# Patient Record
Sex: Male | Born: 1950 | Race: Black or African American | Hispanic: No | Marital: Single | Smoking: Former smoker
Health system: Southern US, Community
[De-identification: ages and names within clinical notes are randomized; demographics above are authoritative.]

## PROBLEM LIST (undated history)

## (undated) DIAGNOSIS — C61 Malignant neoplasm of prostate: Secondary | ICD-10-CM

## (undated) HISTORY — PX: TOTAL HIP ARTHROPLASTY: SHX124

---

## 2014-03-10 ENCOUNTER — Encounter (HOSPITAL_BASED_OUTPATIENT_CLINIC_OR_DEPARTMENT_OTHER): Payer: Self-pay | Admitting: *Deleted

## 2014-03-10 ENCOUNTER — Emergency Department (HOSPITAL_BASED_OUTPATIENT_CLINIC_OR_DEPARTMENT_OTHER): Payer: Non-veteran care

## 2014-03-10 ENCOUNTER — Emergency Department (HOSPITAL_BASED_OUTPATIENT_CLINIC_OR_DEPARTMENT_OTHER)
Admission: EM | Admit: 2014-03-10 | Discharge: 2014-03-10 | Disposition: A | Discharge status: Home / Self Care | Payer: Non-veteran care | Attending: Emergency Medicine | Admitting: Emergency Medicine

## 2014-03-10 DIAGNOSIS — S300XXA Contusion of lower back and pelvis, initial encounter: Secondary | ICD-10-CM | POA: Diagnosis not present

## 2014-03-10 DIAGNOSIS — Y9389 Activity, other specified: Secondary | ICD-10-CM | POA: Insufficient documentation

## 2014-03-10 DIAGNOSIS — S79912A Unspecified injury of left hip, initial encounter: Secondary | ICD-10-CM | POA: Insufficient documentation

## 2014-03-10 DIAGNOSIS — Y998 Other external cause status: Secondary | ICD-10-CM | POA: Diagnosis not present

## 2014-03-10 DIAGNOSIS — Z7982 Long term (current) use of aspirin: Secondary | ICD-10-CM | POA: Diagnosis not present

## 2014-03-10 DIAGNOSIS — W1839XA Other fall on same level, initial encounter: Secondary | ICD-10-CM | POA: Insufficient documentation

## 2014-03-10 DIAGNOSIS — Z96642 Presence of left artificial hip joint: Secondary | ICD-10-CM | POA: Insufficient documentation

## 2014-03-10 DIAGNOSIS — Y929 Unspecified place or not applicable: Secondary | ICD-10-CM | POA: Insufficient documentation

## 2014-03-10 DIAGNOSIS — Z79899 Other long term (current) drug therapy: Secondary | ICD-10-CM | POA: Insufficient documentation

## 2014-03-10 DIAGNOSIS — W19XXXA Unspecified fall, initial encounter: Secondary | ICD-10-CM

## 2014-03-10 DIAGNOSIS — Z880 Allergy status to penicillin: Secondary | ICD-10-CM | POA: Insufficient documentation

## 2014-03-10 DIAGNOSIS — T148XXA Other injury of unspecified body region, initial encounter: Secondary | ICD-10-CM

## 2014-03-10 DIAGNOSIS — S3992XA Unspecified injury of lower back, initial encounter: Secondary | ICD-10-CM | POA: Diagnosis present

## 2014-03-10 MED ORDER — OXYCODONE-ACETAMINOPHEN 5-325 MG PO TABS: 1.0000 | ORAL_TABLET | ORAL | Status: DC | PRN

## 2014-03-10 NOTE — Discharge Instructions (Signed)
Contusion °A contusion is a deep bruise. Contusions happen when an injury causes bleeding under the skin. Signs of bruising include pain, puffiness (swelling), and discolored skin. The contusion may turn blue, purple, or yellow. °HOME CARE  °· Put ice on the injured area. °¨ Put ice in a plastic bag. °¨ Place a towel between your skin and the bag. °¨ Leave the ice on for 15-20 minutes, 03-04 times a day. °· Only take medicine as told by your doctor. °· Rest the injured area. °· If possible, raise (elevate) the injured area to lessen puffiness. °GET HELP RIGHT AWAY IF:  °· You have more bruising or puffiness. °· You have pain that is getting worse. °· Your puffiness or pain is not helped by medicine. °MAKE SURE YOU:  °· Understand these instructions. °· Will watch your condition. °· Will get help right away if you are not doing well or get worse. °Document Released: 09/19/2007 Document Revised: 06/25/2011 Document Reviewed: 02/05/2011 °ExitCare® Patient Information ©2015 [employer]. This information is not intended to replace advice given to you by your health care provider. Make sure you discuss any questions you have with your health care provider. ° °

## 2014-03-10 NOTE — ED Provider Notes (Signed)
CSN: [id number]     Arrival date & time 03/10/14  0730 History   None    Chief Complaint  Patient presents with  . Fall     (Consider location/radiation/quality/duration/timing/severity/associated sxs/prior Treatment) HPI Comments: Patient presents to the ER for evaluation of injury from a fall. Patient reports that he got up to go to the bathroom this morning and his left hip gave out. He fell to the ground, landing on his left side. No head injury or loss of consciousness. Patient reports that he is experiencing severe pain in the left hip and across his lower back. No upper back or neck pain. Denies chest pain, shortness of breath. Fall was caused by his leg giving out, he did not pass out.  She reports that he had left hip surgery at [facility] several months ago. He thinks that's what caused his leg give out. He is concerned that he might have injured the hip when he fell.  Patient is a 63 y.o. male presenting with fall.  Fall    History reviewed. No pertinent past medical history. Past Surgical History  Procedure Laterality Date  . Total hip arthroplasty     History reviewed. No pertinent family history. History  Substance Use Topics  . Smoking status: Former Research scientist (life sciences)  . Smokeless tobacco: Not on file  . Alcohol Use: Not on file    Review of Systems  Musculoskeletal: Positive for back pain and arthralgias.  All other systems reviewed and are negative.     Allergies  Bee venom and Penicillins  Home Medications   Prior to Admission medications   Medication Sig Start Date End Date Taking? Authorizing Provider  ascorbic acid (VITAMIN C) 500 MG tablet Take 500 mg by mouth 3 (three) times daily.   Yes Historical Provider, MD  aspirin 325 MG tablet Take 325 mg by mouth daily.   Yes Historical Provider, MD  aspirin-acetaminophen-caffeine (EXCEDRIN MIGRAINE) [phone] MG per tablet Take 2 tablets by mouth every 6 (six) hours as needed for headache.   Yes Historical  Provider, MD  b complex vitamins tablet Take 1 tablet by mouth daily.   Yes Historical Provider, MD  calcium citrate (CALCITRATE - DOSED IN MG ELEMENTAL CALCIUM) 950 MG tablet Take 200 mg of elemental calcium by mouth daily.   Yes Historical Provider, MD  Cholecalciferol 1000 UNITS CHEW Chew by mouth.   Yes Historical Provider, MD  oxyCODONE (OXY IR/ROXICODONE) 5 MG immediate release tablet Take 5 mg by mouth every 6 (six) hours as needed for severe pain.   Yes Historical Provider, MD  oxycodone (OXY-IR) 5 MG capsule Take 5 mg by mouth every 3 (three) hours as needed.   Yes Historical Provider, MD  sennosides-docusate sodium (SENOKOT-S) 8.6-50 MG tablet Take 1 tablet by mouth daily.   Yes Historical Provider, MD  oxyCODONE-acetaminophen (PERCOCET) 5-325 MG per tablet Take 1 tablet by mouth every 4 (four) hours as needed. 03/10/14   Orpah Greek, MD   BP 176/97 mmHg  Pulse 64  Temp(Src) 97.9 F (36.6 C) (Oral)  Resp 16  Ht 5\' 9"  (1.753 m)  Wt 165 lb (74.844 kg)  BMI 24.36 kg/m2  SpO2 100% Physical Exam  Constitutional: He is oriented to person, place, and time. He appears well-developed and well-nourished. No distress.  HENT:  Head: Normocephalic and atraumatic.  Right Ear: Hearing normal.  Left Ear: Hearing normal.  Nose: Nose normal.  Mouth/Throat: Oropharynx is clear and moist and mucous membranes are normal.  Eyes: Conjunctivae and EOM are normal. Pupils are equal, round, and reactive to light.  Neck: Normal range of motion. Neck supple.  Cardiovascular: Regular rhythm, S1 normal and S2 normal.  Exam reveals no gallop and no friction rub.   No murmur heard. Pulmonary/Chest: Effort normal and breath sounds normal. No respiratory distress. He exhibits no tenderness.  Abdominal: Soft. Normal appearance and bowel sounds are normal. There is no hepatosplenomegaly. There is no tenderness. There is no rebound, no guarding, no tenderness at McBurney's point and negative Murphy's  sign. No hernia.  Musculoskeletal: Normal range of motion.       Left hip: He exhibits tenderness and swelling (lateral soft tissue).       Left knee: Normal.       Left ankle: Normal.       Lumbar back: He exhibits tenderness.       Back:  Neurological: He is alert and oriented to person, place, and time. He has normal strength. No cranial nerve deficit or sensory deficit. Coordination normal. GCS eye subscore is 4. GCS verbal subscore is 5. GCS motor subscore is 6.  Skin: Skin is warm, dry and intact. No rash noted. No cyanosis.  Psychiatric: He has a normal mood and affect. His speech is normal and behavior is normal. Thought content normal.  Nursing note and vitals reviewed.   ED Course  Procedures (including critical care time) Labs Review Labs Reviewed - No data to display  Imaging Review Dg Lumbar Spine Complete  03/10/2014   CLINICAL DATA:  Acute bilateral lower back pain after falling at home this morning.  EXAM: LUMBAR SPINE - COMPLETE 4+ VIEW  COMPARISON:  None.  FINDINGS: There is no evidence of lumbar spine fracture. Alignment is normal. Mild degenerative disc disease is noted at L3-4 and L4-5 with anterior osteophyte formation at these levels. Posterior facet joints appear normal. Atherosclerotic calcifications of abdominal aorta are noted.  IMPRESSION: Mild degenerative disc disease is noted at L3-4 and L4-5. No acute abnormality seen in the lumbar spine.   Electronically Signed   By: Sabino Dick M.D.   On: 03/10/2014 08:24   Dg Pelvis 1-2 Views  03/10/2014   CLINICAL DATA:  Acute left hip pain and swelling after fall at home.  EXAM: PELVIS - 1-2 VIEW  COMPARISON:  None.  FINDINGS: There is no evidence of pelvic fracture or diastasis. No pelvic bone lesions are seen. The hip and sacroiliac joints appear normal.  IMPRESSION: Normal pelvis.   Electronically Signed   By: Sabino Dick M.D.   On: 03/10/2014 08:19   Dg Femur Left  03/10/2014   CLINICAL DATA:  Fall with left  hip and leg pain.  Initial encounter.  EXAM: LEFT FEMUR - 2 VIEW  COMPARISON:  None.  FINDINGS: There is no evidence of fracture, dislocation or other focal bone lesions. Soft tissues are unremarkable.  IMPRESSION: No fracture identified.   Electronically Signed   By: Aletta Edouard M.D.   On: 03/10/2014 08:20     EKG Interpretation None      MDM   Final diagnoses:  Contusion   Patient presents to the ER for evaluation of lower back and left hip area pain after a fall. Patient complaining primarily of swelling and tenderness in the soft tissues of the proximal left hip and thigh area. He has some mild diffuse lower back pain and tenderness as well. No neurologic deficit or symptoms. X-ray of the lumbar spine, pelvis, hip, femur are  obtained. No evidence of fracture or abnormality noted.    Orpah Greek, MD 03/10/14 [phone]
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
## 2014-03-10 NOTE — ED Notes (Signed)
Patient transported to X-ray 

## 2015-08-19 ENCOUNTER — Encounter (HOSPITAL_BASED_OUTPATIENT_CLINIC_OR_DEPARTMENT_OTHER): Payer: Self-pay | Admitting: *Deleted

## 2015-08-19 ENCOUNTER — Emergency Department (HOSPITAL_BASED_OUTPATIENT_CLINIC_OR_DEPARTMENT_OTHER)
Admission: EM | Admit: 2015-08-19 | Discharge: 2015-08-19 | Disposition: A | Discharge status: Home / Self Care | Payer: Non-veteran care | Attending: Emergency Medicine | Admitting: Emergency Medicine

## 2015-08-19 DIAGNOSIS — IMO0002 Reserved for concepts with insufficient information to code with codable children: Secondary | ICD-10-CM

## 2015-08-19 DIAGNOSIS — W292XXA Contact with other powered household machinery, initial encounter: Secondary | ICD-10-CM | POA: Insufficient documentation

## 2015-08-19 DIAGNOSIS — Y929 Unspecified place or not applicable: Secondary | ICD-10-CM | POA: Insufficient documentation

## 2015-08-19 DIAGNOSIS — S61211A Laceration without foreign body of left index finger without damage to nail, initial encounter: Secondary | ICD-10-CM | POA: Insufficient documentation

## 2015-08-19 DIAGNOSIS — Z87891 Personal history of nicotine dependence: Secondary | ICD-10-CM | POA: Insufficient documentation

## 2015-08-19 DIAGNOSIS — S61213A Laceration without foreign body of left middle finger without damage to nail, initial encounter: Secondary | ICD-10-CM | POA: Insufficient documentation

## 2015-08-19 DIAGNOSIS — Y999 Unspecified external cause status: Secondary | ICD-10-CM | POA: Insufficient documentation

## 2015-08-19 DIAGNOSIS — S6992XA Unspecified injury of left wrist, hand and finger(s), initial encounter: Secondary | ICD-10-CM | POA: Diagnosis present

## 2015-08-19 DIAGNOSIS — Y9389 Activity, other specified: Secondary | ICD-10-CM | POA: Insufficient documentation

## 2015-08-19 MED ORDER — TETANUS-DIPHTH-ACELL PERTUSSIS 5-2.5-18.5 LF-MCG/0.5 IM SUSP
0.5000 mL | Freq: Once | INTRAMUSCULAR | Status: AC
  Administered 2015-08-19: 0.5 mL via INTRAMUSCULAR
  Filled 2015-08-19: qty 0.5

## 2015-08-19 MED ORDER — BACITRACIN ZINC 500 UNIT/GM EX OINT
1.0000 "application " | TOPICAL_OINTMENT | Freq: Two times a day (BID) | CUTANEOUS | Status: DC
  Administered 2015-08-19: 1 via TOPICAL

## 2015-08-19 NOTE — ED Notes (Signed)
Pt reports injuring his left hand while repairing a dryer, cut 2nd and 3rd fingers with metal of dryer door. Pt states they bled more overnight so he wanted [place] to look at his lacerations. Dressings in place at triage, no active bleeding noted, states td is utd.

## 2015-08-19 NOTE — ED Provider Notes (Signed)
CSN: [id number]     Arrival date & time 08/19/15  1006 History   First MD Initiated Contact with Patient 08/19/15 1023     Chief Complaint  Patient presents with  . Hand Pain     (Consider location/radiation/quality/duration/timing/severity/associated sxs/prior Treatment) HPI Eric Beck is a 65 y.o. male with no sign of an past medical history comes in for evaluation of left hand laceration. Patient reports he was working on a dryer yesterday around 10:30 AM when the dryer slipped causing him to cut his left index and middle finger. He reports cleaning the wound with tap water and alcohol swabs and wrapping it in gauze and Band-Aids. He reports pain has persisted since that time and wanted to come to ED for evaluation. He is unsure of last tetanus shot.  History reviewed. No pertinent past medical history. Past Surgical History  Procedure Laterality Date  . Total hip arthroplasty     History reviewed. No pertinent family history. Social History  Substance Use Topics  . Smoking status: Former Research scientist (life sciences)  . Smokeless tobacco: None  . Alcohol Use: None    Review of Systems A 10 point review of systems was completed and was negative except for pertinent positives and negatives as mentioned in the history of present illness    Allergies  Bee venom and Penicillins  Home Medications   Prior to Admission medications   Not on File   BP 135/85 mmHg  Pulse 60  Temp(Src) 98 F (36.7 C) (Oral)  Resp 18  Ht 5\' 7"  (1.702 m)  Wt 68.04 kg  BMI 23.49 kg/m2  SpO2 100% Physical Exam  Constitutional:  Awake, alert, nontoxic appearance.  HENT:  Head: Atraumatic.  Eyes: Right eye exhibits no discharge. Left eye exhibits no discharge.  Neck: Neck supple.  Pulmonary/Chest: Effort normal. He exhibits no tenderness.  Abdominal: Soft. There is no tenderness. There is no rebound.  Musculoskeletal: He exhibits no tenderness.  Baseline ROM, no obvious new focal weakness.  Neurological:   Mental status and motor strength appears baseline for patient and situation.  Skin: No rash noted.  Patient with 2 linear lacerations, superficial in nature over dorsum of index and middle left fingers around the IP joint. No tendon involvement. Patient is able to flex and extend digits against resistance. Bleeding controlled. No surrounding erythema, induration or drainage.  Psychiatric: He has a normal mood and affect.  Nursing note and vitals reviewed.   ED Course  Procedures (including critical care time) Labs Review Labs Reviewed - No data to display  Imaging Review No results found. I have personally reviewed and evaluated these images and lab results as part of my medical decision-making.   EKG Interpretation None     Filed Vitals:   08/19/15 1022 08/19/15 1148  BP: 138/99 135/85  Pulse: 66 60  Temp: 98.3 F (36.8 C) 98 F (36.7 C)  TempSrc: Oral Oral  Resp: 18 18  Height: 5\' 7"  (1.702 m)   Weight: 68.04 kg   SpO2: 100% 100%    MDM  ANTHONEY DICKE is a 65 y.o. male who presents for evaluation of a laceration occurred roughly 24 hours ago. Patient applied alcohol swabs and wash wound with tap water. Came in for reevaluation. Superficial lacerations on dorsum of left index and middle finger. Neurovascularly intact. His tetanus was unknown-updated in emergency Department. Wound will be allowed to heal via secondary intention and instructed wound recheck by PCP in 3 or 4 days. Topical antibiotic  and dressing applied. No oral antibiotics indicated, no medical history that would impede normal wound healing. Overall, patient appears well, nontoxic, hemodynamically stable and appropriate for discharge and outpatient follow-up. Final diagnoses:  Laceration        Comer Locket, PA-C 08/19/15 [id number]  Blanchie Dessert, MD 08/19/15 [phone]

## 2015-08-19 NOTE — Discharge Instructions (Signed)
Please keep your wound clean and dry. Your tetanus was updated in the emergency department today. Follow-up with your doctor in the next 3 or 4 days for a wound recheck. Return to ED for any new or worsening symptoms as we discussed.

## 2015-09-21 ENCOUNTER — Emergency Department (HOSPITAL_BASED_OUTPATIENT_CLINIC_OR_DEPARTMENT_OTHER)
Admission: EM | Admit: 2015-09-21 | Discharge: 2015-09-21 | Disposition: A | Discharge status: Home / Self Care | Payer: Non-veteran care | Attending: Emergency Medicine | Admitting: Emergency Medicine

## 2015-09-21 ENCOUNTER — Encounter (HOSPITAL_BASED_OUTPATIENT_CLINIC_OR_DEPARTMENT_OTHER): Payer: Self-pay

## 2015-09-21 ENCOUNTER — Emergency Department (HOSPITAL_BASED_OUTPATIENT_CLINIC_OR_DEPARTMENT_OTHER): Payer: Non-veteran care

## 2015-09-21 DIAGNOSIS — Z79899 Other long term (current) drug therapy: Secondary | ICD-10-CM | POA: Diagnosis not present

## 2015-09-21 DIAGNOSIS — Z87891 Personal history of nicotine dependence: Secondary | ICD-10-CM | POA: Insufficient documentation

## 2015-09-21 DIAGNOSIS — Z7982 Long term (current) use of aspirin: Secondary | ICD-10-CM | POA: Insufficient documentation

## 2015-09-21 DIAGNOSIS — M25551 Pain in right hip: Secondary | ICD-10-CM

## 2015-09-21 NOTE — Discharge Instructions (Signed)
Please read and follow all provided instructions.  Your diagnoses today include:  1. Hip pain, right    Tests performed today include:  Vital signs - see below for your results today  X-ray of hip - does not show any problems  Medications prescribed:   None  Take any prescribed medications only as directed.  Home care instructions:   Follow any educational materials contained in this packet  Please rest, use ice or heat on your back for the next several days  Do not lift, push, pull anything more than 10 pounds for the next week  Follow-up instructions: Please follow-up with your primary care provider or orthopedist in the next 1 week for further evaluation of your symptoms.   Return instructions:  SEEK IMMEDIATE MEDICAL ATTENTION IF YOU HAVE:  New numbness, tingling, weakness, or problem with the use of your arms or legs  Severe back pain not relieved with medications  Loss control of your bowels or bladder  Increasing pain in any areas of the body (such as chest or abdominal pain)  Shortness of breath, dizziness, or fainting.   Worsening nausea (feeling sick to your stomach), vomiting, fever, or sweats  Any other emergent concerns regarding your health   Additional Information:  Your vital signs today were: BP 164/97 mmHg   Pulse 82   Temp(Src) 98.5 F (36.9 C) (Oral)   Resp 18   SpO2 99% If your blood pressure (BP) was elevated above 135/85 this visit, please have this repeated by your doctor within one month. --------------

## 2015-09-21 NOTE — ED Notes (Signed)
Pt here for hip pain, rt hip pain, radiates to rt leg, describes a burning sensation

## 2015-09-21 NOTE — ED Notes (Signed)
Pt returns from radiology. 

## 2015-09-21 NOTE — ED Notes (Signed)
PA-C in at bedside to see pt
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
## 2015-12-31 IMAGING — CR DG LUMBAR SPINE COMPLETE 4+V
5 series · 5 of 5 positions shown · non-contrast
Comparison: None.

CLINICAL DATA: Acute bilateral lower back pain after falling at
home this morning.

EXAM:
LUMBAR SPINE - COMPLETE 4+ VIEW

[t l-spine a.p.]
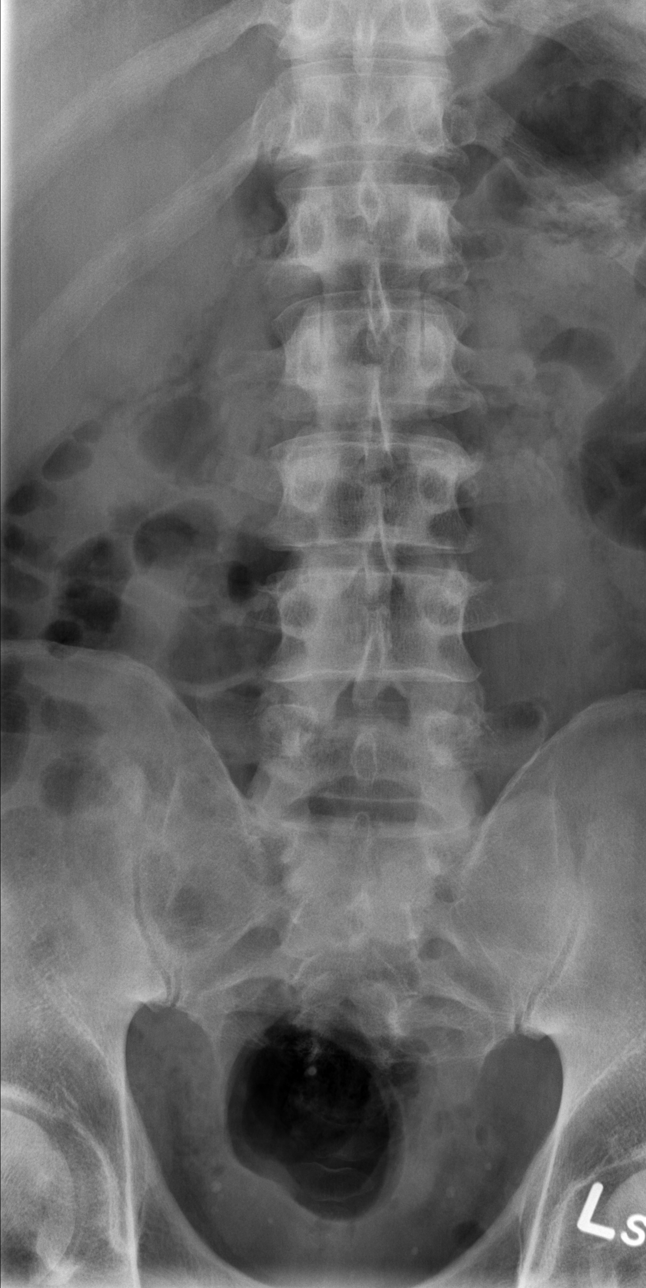

[t l-spine oblique exposure (1 of 2)]
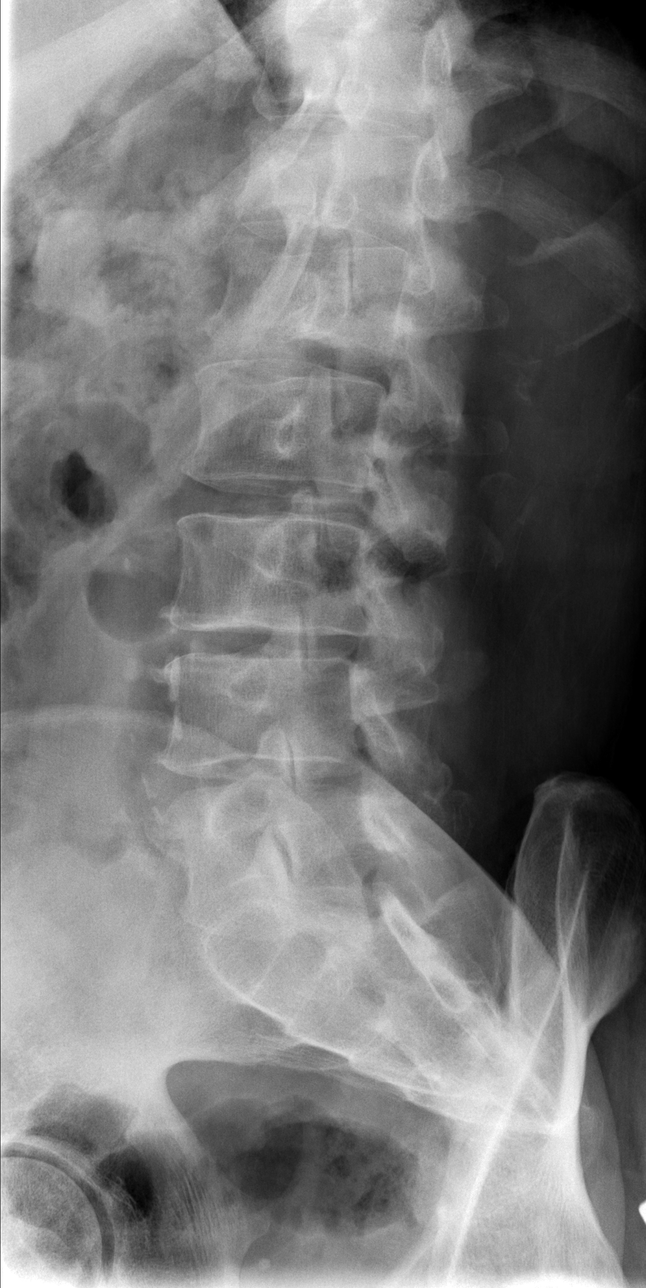

[t l-spine oblique exposure (2 of 2)]
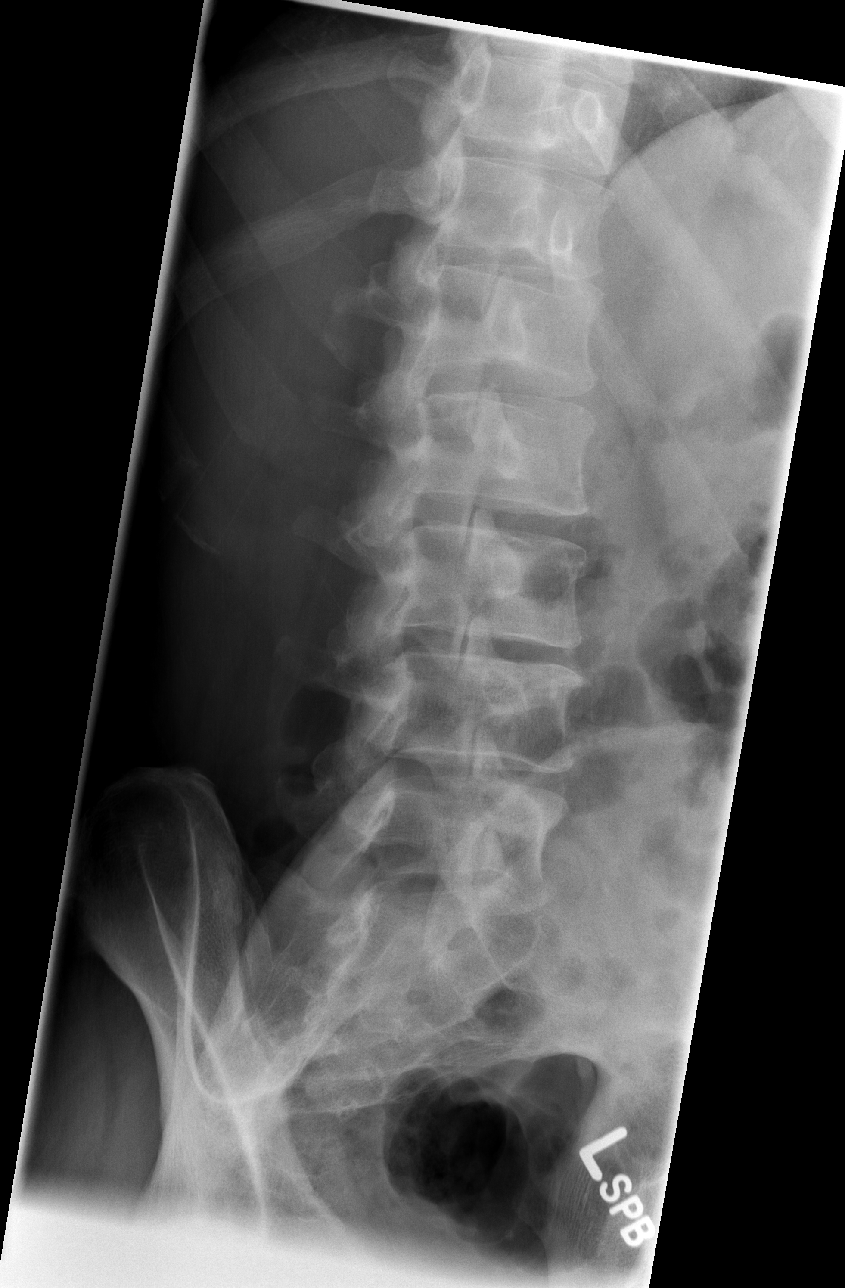

[t l-spine lat]
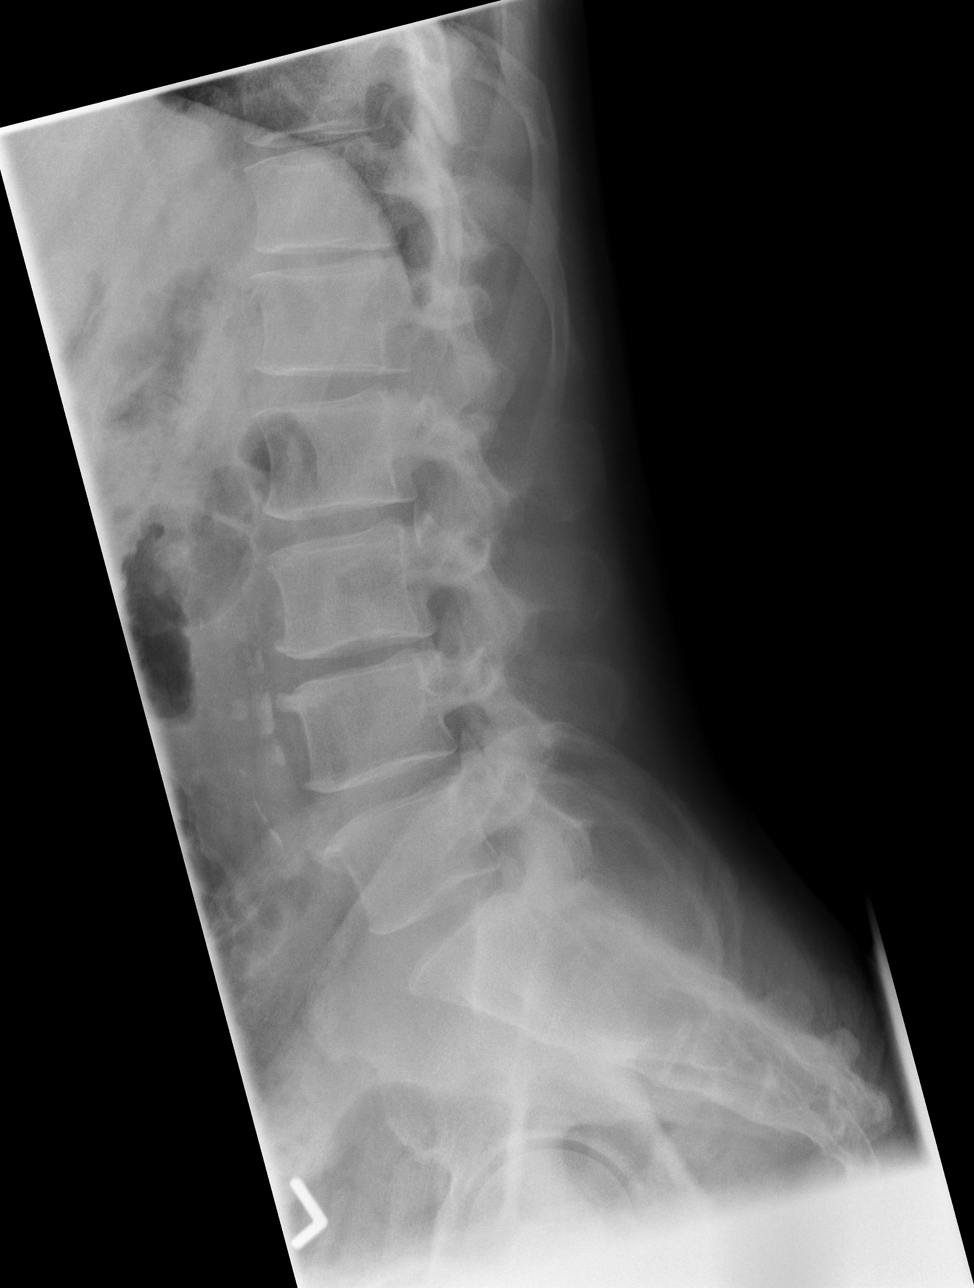

[t l-spine l5-s1 spot]
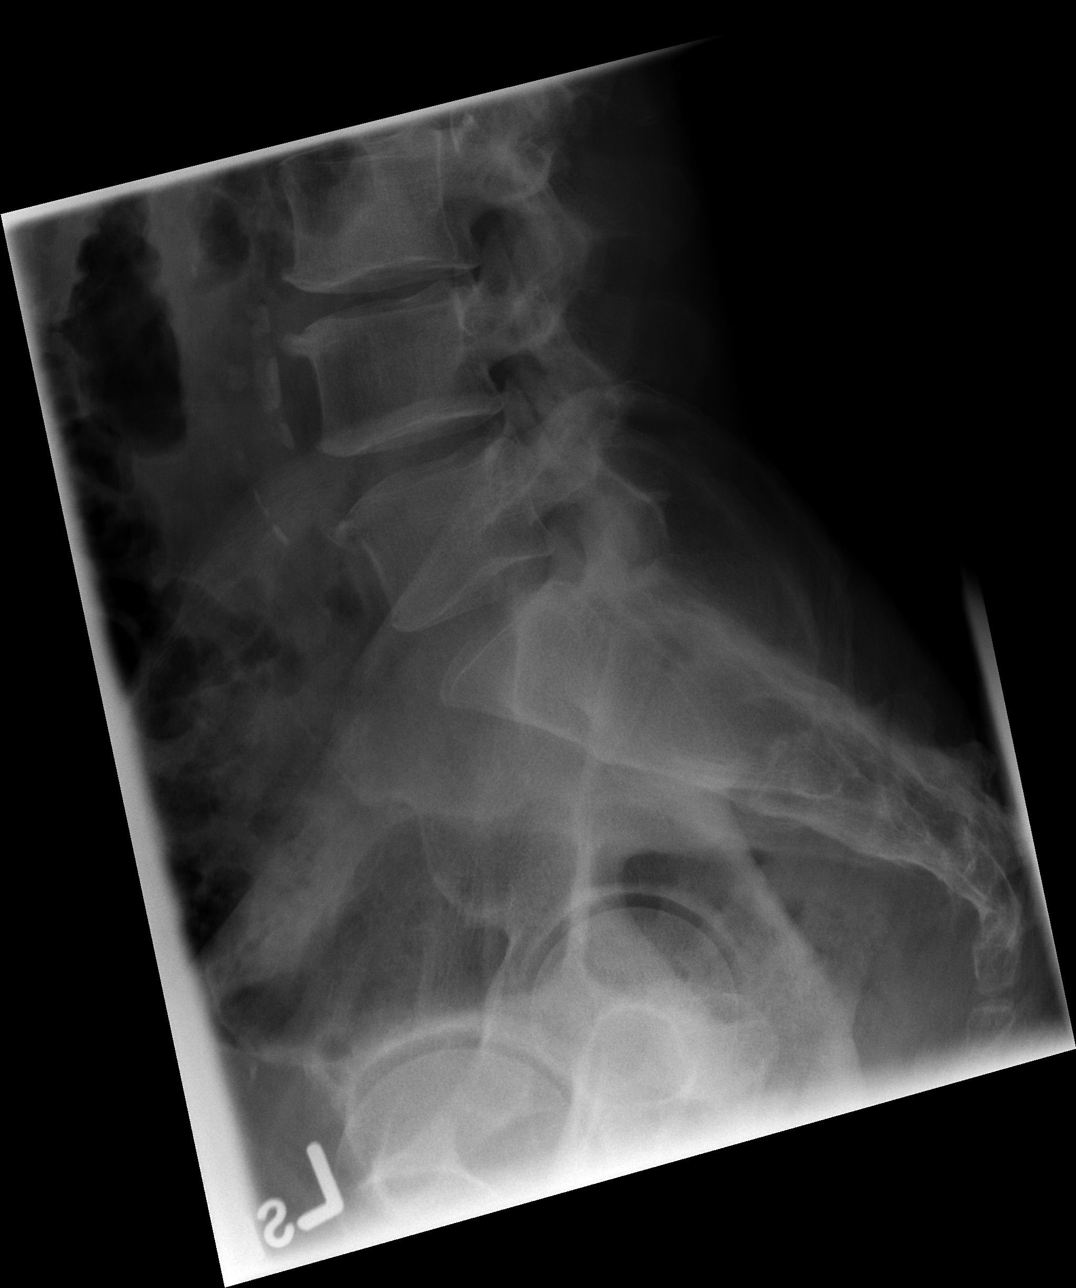

[5 of 5 positions shown; findings below may reference images not displayed]

FINDINGS: There is no evidence of lumbar spine fracture. Alignment is normal.
Mild degenerative disc disease is noted at L3-4 and L4-5 with
anterior osteophyte formation at these levels. Posterior facet
joints appear normal. Atherosclerotic calcifications of abdominal
aorta are noted.
IMPRESSION: Mild degenerative disc disease is noted at L3-4 and L4-5. No acute
abnormality seen in the lumbar spine.

## 2016-12-11 ENCOUNTER — Emergency Department (HOSPITAL_COMMUNITY): Payer: Non-veteran care

## 2016-12-11 ENCOUNTER — Encounter (HOSPITAL_COMMUNITY): Payer: Self-pay | Admitting: *Deleted

## 2016-12-11 ENCOUNTER — Emergency Department (HOSPITAL_COMMUNITY)
Admission: EM | Admit: 2016-12-11 | Discharge: 2016-12-12 | Disposition: A | Discharge status: Home / Self Care | Payer: Non-veteran care | Attending: Emergency Medicine | Admitting: Emergency Medicine

## 2016-12-11 DIAGNOSIS — K6289 Other specified diseases of anus and rectum: Secondary | ICD-10-CM | POA: Insufficient documentation

## 2016-12-11 HISTORY — DX: Malignant neoplasm of prostate: C61

## 2016-12-11 LAB — CBC WITH DIFFERENTIAL/PLATELET
Basophils Absolute: 0 10*3/uL (ref 0.0–0.1)
Basophils Relative: 0 %
Eosinophils Absolute: 0.1 10*3/uL (ref 0.0–0.7)
Eosinophils Relative: 1 %
HEMATOCRIT: 33.7 % — AB (ref 39.0–52.0)
HEMOGLOBIN: 11.4 g/dL — AB (ref 13.0–17.0)
LYMPHS ABS: 2.4 10*3/uL (ref 0.7–4.0)
LYMPHS PCT: 24 %
MCH: 30.2 pg (ref 26.0–34.0)
MCHC: 33.8 g/dL (ref 30.0–36.0)
MCV: 89.2 fL (ref 78.0–100.0)
Monocytes Absolute: 0.8 10*3/uL (ref 0.1–1.0)
Monocytes Relative: 8 %
NEUTROS ABS: 6.6 10*3/uL (ref 1.7–7.7)
NEUTROS PCT: 67 %
Platelets: 267 10*3/uL (ref 150–400)
RBC: 3.78 MIL/uL — ABNORMAL LOW (ref 4.22–5.81)
RDW: 12.9 % (ref 11.5–15.5)
WBC: 9.9 10*3/uL (ref 4.0–10.5)

## 2016-12-11 LAB — COMPREHENSIVE METABOLIC PANEL
ALT: 25 U/L (ref 17–63)
ANION GAP: 8 (ref 5–15)
AST: 31 U/L (ref 15–41)
Albumin: 4 g/dL (ref 3.5–5.0)
Alkaline Phosphatase: 85 U/L (ref 38–126)
BUN: 14 mg/dL (ref 6–20)
CALCIUM: 10 mg/dL (ref 8.9–10.3)
CO2: 27 mmol/L (ref 22–32)
Chloride: 105 mmol/L (ref 101–111)
Creatinine, Ser: 0.95 mg/dL (ref 0.61–1.24)
GFR calc non Af Amer: >60 mL/min (ref >60)
Glucose, Bld: 116 mg/dL — ABNORMAL HIGH (ref 65–99)
Potassium: 3.7 mmol/L (ref 3.5–5.1)
Sodium: 140 mmol/L (ref 135–145)
Total Bilirubin: 0.5 mg/dL (ref 0.3–1.2)
Total Protein: 8.9 g/dL — ABNORMAL HIGH (ref 6.5–8.1)

## 2016-12-11 MED ORDER — DOCUSATE SODIUM 100 MG PO CAPS
100.0000 mg | ORAL_CAPSULE | Freq: Once | ORAL | Status: AC
  Administered 2016-12-11: 100 mg via ORAL
  Filled 2016-12-11: qty 1

## 2016-12-11 MED ORDER — KETOROLAC TROMETHAMINE 30 MG/ML IJ SOLN
15.0000 mg | Freq: Once | INTRAMUSCULAR | Status: AC
  Administered 2016-12-11: 15 mg via INTRAVENOUS
  Filled 2016-12-11: qty 1

## 2016-12-11 MED ORDER — SULFASALAZINE 500 MG PO TBEC
500.0000 mg | DELAYED_RELEASE_TABLET | Freq: Four times a day (QID) | ORAL | Status: DC
  Administered 2016-12-11: 500 mg via ORAL
  Filled 2016-12-11: qty 1

## 2016-12-11 MED ORDER — DOCUSATE SODIUM 100 MG PO CAPS: 100.0000 mg | ORAL_CAPSULE | Freq: Two times a day (BID) | ORAL | 0 refills | Status: DC

## 2016-12-11 MED ORDER — POLYETHYLENE GLYCOL 3350 17 G PO PACK: 17.0000 g | PACK | Freq: Every day | ORAL | 0 refills | Status: DC

## 2016-12-11 MED ORDER — IOPAMIDOL (ISOVUE-300) INJECTION 61%
INTRAVENOUS | Status: AC
  Filled 2016-12-11: qty 100

## 2016-12-11 MED ORDER — HYDROMORPHONE HCL 1 MG/ML IJ SOLN
1.0000 mg | Freq: Once | INTRAMUSCULAR | Status: AC
  Administered 2016-12-11: 1 mg via INTRAVENOUS
  Filled 2016-12-11: qty 1

## 2016-12-11 MED ORDER — PREDNISONE 20 MG PO TABS
60.0000 mg | ORAL_TABLET | Freq: Once | ORAL | Status: AC
  Administered 2016-12-11: 60 mg via ORAL
  Filled 2016-12-11: qty 3

## 2016-12-11 MED ORDER — OXYCODONE-ACETAMINOPHEN 5-325 MG PO TABS
1.0000 | ORAL_TABLET | Freq: Once | ORAL | Status: AC
  Administered 2016-12-11: 1 via ORAL
  Filled 2016-12-11: qty 1

## 2016-12-11 MED ORDER — SODIUM CHLORIDE 0.9 % IV BOLUS (SEPSIS)
1000.0000 mL | Freq: Once | INTRAVENOUS | Status: AC
  Administered 2016-12-11: 1000 mL via INTRAVENOUS

## 2016-12-11 MED ORDER — IBUPROFEN 400 MG PO TABS: 400.0000 mg | ORAL_TABLET | Freq: Four times a day (QID) | ORAL | 0 refills | Status: DC | PRN

## 2016-12-11 MED ORDER — PREDNISONE 20 MG PO TABS: ORAL_TABLET | ORAL | 0 refills | Status: DC

## 2016-12-11 MED ORDER — IOPAMIDOL (ISOVUE-300) INJECTION 61%
100.0000 mL | Freq: Once | INTRAVENOUS | Status: AC | PRN
  Administered 2016-12-11: 100 mL via INTRAVENOUS

## 2016-12-11 MED ORDER — OXYCODONE-ACETAMINOPHEN 5-325 MG PO TABS: 1.0000 | ORAL_TABLET | ORAL | 0 refills | Status: DC | PRN

## 2016-12-11 NOTE — ED Notes (Signed)
Patient reports finished radiation for prostate cancer on Friday 8/24 and started having severe pain lower abd where had radiation at.  Patient reports urinary retention and dysuria.  Patient had one episode of diarrhea today, and had prior constipation before that.

## 2016-12-11 NOTE — ED Provider Notes (Signed)
[facility] DEPT Provider Note   CSN: [id number] Arrival date & time: 12/11/16  1728     History   Chief Complaint Chief Complaint  Patient presents with  . Abdominal Pain    HPI Eric Beck is a 66 y.o. male.   Abdominal Pain    66 year old male who recently finished multiple rounds of radiation for prostate cancer presents with rectal pain when having a bowel movement. States has been getting worse but is been present for at least a couple weeks. No blood in his stool just feels hesitancy to have a bowel movement secondary to known to have pain. Has constipation secondary to that. No nausea or vomiting. No fevers. No other associated or modifying symptoms.  Past Medical History:  Diagnosis Date  . Prostate CA ([place])     There are no active problems to display for this patient.   Past Surgical History:  Procedure Laterality Date  . TOTAL HIP ARTHROPLASTY         Home Medications    Prior to Admission medications   Medication Sig Start Date End Date Taking? Authorizing Provider  acetaminophen (TYLENOL) 500 MG tablet Take 1,000 mg by mouth every 6 (six) hours as needed.   Yes [provider]  aspirin 325 MG tablet Take 325 mg by mouth every 6 (six) hours as needed for mild pain or headache.    Yes [provider]  b complex vitamins tablet Take 1 tablet by mouth daily.   Yes [provider]  cholecalciferol (VITAMIN D) 1000 units tablet Take 1,000 Units by mouth daily.   Yes [provider]  sennosides-docusate sodium (SENOKOT-S) 8.6-50 MG tablet Take 1 tablet by mouth 2 (two) times daily.   Yes [provider]  Tetrahydrozoline HCl (VISINE OP) Apply 2 drops to eye daily as needed (Eyes Burning).   Yes [provider]  traMADol (ULTRAM) 50 MG tablet Take by mouth every 8 (eight) hours as needed.   Yes [provider]  docusate sodium (COLACE) 100 MG capsule Take 1 capsule (100 mg total) by mouth  every 12 (twelve) hours. 12/11/16   Rina Adney, Corene Cornea, MD  ibuprofen (ADVIL,MOTRIN) 400 MG tablet Take 1 tablet (400 mg total) by mouth every 6 (six) hours as needed. 12/11/16   Andrae Claunch, Corene Cornea, MD  oxyCODONE-acetaminophen (PERCOCET) 5-325 MG tablet Take 1-2 tablets by mouth every 4 (four) hours as needed. 12/11/16   Jakylah Bassinger, Corene Cornea, MD  polyethylene glycol (MIRALAX / GLYCOLAX) packet Take 17 g by mouth daily. 12/11/16   Nance Mccombs, Corene Cornea, MD  predniSONE (DELTASONE) 20 MG tablet 3 tabs po daily x 3 days, then 2 tabs x 3 days, then 1.5 tabs x 3 days, then 1 tab x 3 days, then 0.5 tabs x 3 days 12/11/16   Veralyn Lopp, Corene Cornea, MD    Family History No family history on file.  Social History Social History  Substance Use Topics  . Smoking status: Former Research scientist (life sciences)  . Smokeless tobacco: Never Used  . Alcohol use No     Allergies   Bee venom and Penicillins   Review of Systems Review of Systems  Gastrointestinal: Positive for abdominal pain.  All other systems reviewed and are negative.    Physical Exam Updated Vital Signs BP (!) 149/85 (BP Location: Right Arm)   Pulse 100   Temp 98.7 F (37.1 C) (Oral)   Resp 20   SpO2 100%   Physical Exam  Constitutional: He appears well-developed and well-nourished.  HENT:  Head:  Normocephalic and atraumatic.  Eyes: Conjunctivae and EOM are normal.  Neck: Normal range of motion.  Cardiovascular: Normal rate.   Pulmonary/Chest: Effort normal. No respiratory distress.  Abdominal: Soft. He exhibits no distension. There is no tenderness.  Musculoskeletal: Normal range of motion.  Neurological: He is alert.  Skin: Skin is warm and dry.  Nursing note and vitals reviewed.    ED Treatments / Results  Labs (all labs ordered are listed, but only abnormal results are displayed) Labs Reviewed  CBC WITH DIFFERENTIAL/PLATELET - Abnormal; Notable for the following:       Result Value   RBC 3.78 (*)    Hemoglobin 11.4 (*)    HCT 33.7 (*)    All other components  within normal limits  COMPREHENSIVE METABOLIC PANEL - Abnormal; Notable for the following:    Glucose, Bld 116 (*)    Total Protein 8.9 (*)    All other components within normal limits    EKG  EKG Interpretation None       Radiology Ct Abdomen Pelvis W Contrast  Result Date: 12/11/2016 CLINICAL DATA:  Radiation for prostate cancer, severe lower abdominal pain, urinary retention EXAM: CT ABDOMEN AND PELVIS WITH CONTRAST TECHNIQUE: Multidetector CT imaging of the abdomen and pelvis was performed using the standard protocol following bolus administration of intravenous contrast. CONTRAST:  167mL ISOVUE-300 IOPAMIDOL (ISOVUE-300) INJECTION 61% COMPARISON:  None. FINDINGS: Lower chest: No acute abnormality. Hepatobiliary: No focal liver abnormality is seen. No gallstones, gallbladder wall thickening, or biliary dilatation. Pancreas: Unremarkable. No pancreatic ductal dilatation or surrounding inflammatory changes. Spleen: Normal in size without focal abnormality. Adrenals/Urinary Tract: Adrenal glands are within normal limits. Negative for hydronephrosis. subcentimeter hypodensity within the mid right kidney, too small to further characterize. Mild wall thickening of the posterior wall and base of bladder. Stomach/Bowel: Stomach is within normal limits. Appendix appears normal. No evidence of bowel wall thickening, distention, or inflammatory changes. Vascular/Lymphatic: Aortic atherosclerosis. No enlarged abdominal or pelvic lymph nodes. Reproductive: Slightly enlarged prostate with coarse central calcification. Other: Negative for free air or free fluid Musculoskeletal: No acute osseous abnormality. Degenerative changes most notable at L3-L4 with prominent Schmorl's node inferior endplate of L3. IMPRESSION: 1. Mild wall thickening of the posterior bladder wall and bladder base, could relate to a cystitis, either due to infection or history of radiation therapy. 2. Negative for hydronephrosis 3.  Slightly enlarged prostate with coarse central calcification Electronically Signed   By: Donavan Foil M.D.   On: 12/11/2016 19:59    Procedures Procedures (including critical care time)  Medications Ordered in ED Medications  iopamidol (ISOVUE-300) 61 % injection (not administered)  sulfaSALAzine (AZULFIDINE) EC tablet 500 mg (500 mg Oral Given 12/11/16 2259)  HYDROmorphone (DILAUDID) injection 1 mg (1 mg Intravenous Given 12/11/16 1845)  sodium chloride 0.9 % bolus 1,000 mL (0 mLs Intravenous Stopped 12/11/16 2137)  iopamidol (ISOVUE-300) 61 % injection 100 mL (100 mLs Intravenous Contrast Given 12/11/16 1936)  ketorolac (TORADOL) 30 MG/ML injection 15 mg (15 mg Intravenous Given 12/11/16 2208)  predniSONE (DELTASONE) tablet 60 mg (60 mg Oral Given 12/11/16 2208)  oxyCODONE-acetaminophen (PERCOCET/ROXICET) 5-325 MG per tablet 1 tablet (1 tablet Oral Given 12/11/16 2208)  docusate sodium (COLACE) capsule 100 mg (100 mg Oral Given 12/11/16 2208)  sodium chloride 0.9 % bolus 1,000 mL (0 mLs Intravenous Stopped 12/11/16 2352)  oxyCODONE-acetaminophen (PERCOCET/ROXICET) 5-325 MG per tablet 1 tablet (1 tablet Oral Given 12/11/16 2344)     Initial Impression / Assessment and Plan /  ED Course  I have reviewed the triage vital signs and the nursing notes.  Pertinent labs & imaging results that were available during my care of the patient were reviewed by me and considered in my medical decision making (see chart for details).    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  Workup consistent with proctitis. Symptoms improved with meds here, will dc on same with pcp/rad onc follow up.   Final Clinical Impressions(s) / ED Diagnoses   Final diagnoses:  Proctitis    New Prescriptions New Prescriptions   DOCUSATE SODIUM (COLACE) 100 MG CAPSULE    Take 1 capsule (100 mg total) by mouth every 12 (twelve) hours.   IBUPROFEN (ADVIL,MOTRIN)  400 MG TABLET    Take 1 tablet (400 mg total) by mouth every 6 (six) hours as needed.   OXYCODONE-ACETAMINOPHEN (PERCOCET) 5-325 MG TABLET    Take 1-2 tablets by mouth every 4 (four) hours as needed.   POLYETHYLENE GLYCOL (MIRALAX / GLYCOLAX) PACKET    Take 17 g by mouth daily.   PREDNISONE (DELTASONE) 20 MG TABLET    3 tabs po daily x 3 days, then 2 tabs x 3 days, then 1.5 tabs x 3 days, then 1 tab x 3 days, then 0.5 tabs x 3 days     Merrily Pew, MD 12/11/16 2357

## 2016-12-11 NOTE — ED Triage Notes (Signed)
Pt complains of left lower quadrant pain and diarrhea. Pt has hx of prostate cancer. Pt was given 171mcg fentanyl en route to hospital.

## 2016-12-16 ENCOUNTER — Emergency Department (HOSPITAL_COMMUNITY)
Admission: EM | Admit: 2016-12-16 | Discharge: 2016-12-16 | Disposition: A | Discharge status: Home / Self Care | Payer: Medicare Other | Attending: Emergency Medicine | Admitting: Emergency Medicine

## 2016-12-16 ENCOUNTER — Encounter (HOSPITAL_COMMUNITY): Payer: Self-pay | Admitting: Emergency Medicine

## 2016-12-16 DIAGNOSIS — N390 Urinary tract infection, site not specified: Secondary | ICD-10-CM | POA: Diagnosis not present

## 2016-12-16 DIAGNOSIS — Z87891 Personal history of nicotine dependence: Secondary | ICD-10-CM | POA: Insufficient documentation

## 2016-12-16 DIAGNOSIS — Z96649 Presence of unspecified artificial hip joint: Secondary | ICD-10-CM | POA: Diagnosis not present

## 2016-12-16 DIAGNOSIS — R1030 Lower abdominal pain, unspecified: Secondary | ICD-10-CM | POA: Diagnosis present

## 2016-12-16 LAB — CBC
HEMATOCRIT: 33.4 % — AB (ref 39.0–52.0)
Hemoglobin: 11.4 g/dL — ABNORMAL LOW (ref 13.0–17.0)
MCH: 30.4 pg (ref 26.0–34.0)
MCHC: 34.1 g/dL (ref 30.0–36.0)
MCV: 89.1 fL (ref 78.0–100.0)
Platelets: 310 10*3/uL (ref 150–400)
RBC: 3.75 MIL/uL — ABNORMAL LOW (ref 4.22–5.81)
RDW: 12.8 % (ref 11.5–15.5)
WBC: 10.1 10*3/uL (ref 4.0–10.5)

## 2016-12-16 LAB — COMPREHENSIVE METABOLIC PANEL
ALBUMIN: 3.5 g/dL (ref 3.5–5.0)
ALT: 23 U/L (ref 17–63)
AST: 30 U/L (ref 15–41)
Alkaline Phosphatase: 73 U/L (ref 38–126)
Anion gap: 10 (ref 5–15)
BUN: 23 mg/dL — AB (ref 6–20)
CHLORIDE: 109 mmol/L (ref 101–111)
CO2: 25 mmol/L (ref 22–32)
Calcium: 9.9 mg/dL (ref 8.9–10.3)
Creatinine, Ser: 1.18 mg/dL (ref 0.61–1.24)
GFR calc Af Amer: >60 mL/min (ref >60)
Glucose, Bld: 106 mg/dL — ABNORMAL HIGH (ref 65–99)
POTASSIUM: 3.5 mmol/L (ref 3.5–5.1)
SODIUM: 144 mmol/L (ref 135–145)
Total Bilirubin: 0.4 mg/dL (ref 0.3–1.2)
Total Protein: 8.1 g/dL (ref 6.5–8.1)

## 2016-12-16 LAB — URINALYSIS, ROUTINE W REFLEX MICROSCOPIC
BILIRUBIN URINE: NEGATIVE
Glucose, UA: NEGATIVE mg/dL
KETONES UR: NEGATIVE mg/dL
Nitrite: NEGATIVE
Protein, ur: 100 mg/dL — AB
Specific Gravity, Urine: 1.023 (ref 1.005–1.030)
pH: 5 (ref 5.0–8.0)

## 2016-12-16 LAB — LIPASE, BLOOD: LIPASE: 108 U/L — AB (ref 11–51)

## 2016-12-16 MED ORDER — HYDROMORPHONE HCL 1 MG/ML IJ SOLN
1.0000 mg | Freq: Once | INTRAMUSCULAR | Status: AC
  Administered 2016-12-16: 1 mg via INTRAVENOUS
  Filled 2016-12-16: qty 1

## 2016-12-16 MED ORDER — OXYCODONE-ACETAMINOPHEN 5-325 MG PO TABS: 1.0000 | ORAL_TABLET | Freq: Four times a day (QID) | ORAL | 0 refills | Status: DC | PRN

## 2016-12-16 MED ORDER — CIPROFLOXACIN HCL 500 MG PO TABS: 500.0000 mg | ORAL_TABLET | Freq: Two times a day (BID) | ORAL | 0 refills | Status: AC

## 2016-12-16 MED ORDER — KETOROLAC TROMETHAMINE 15 MG/ML IJ SOLN
15.0000 mg | Freq: Once | INTRAMUSCULAR | Status: AC
  Administered 2016-12-16: 15 mg via INTRAVENOUS
  Filled 2016-12-16: qty 1

## 2016-12-16 MED ORDER — SODIUM CHLORIDE 0.9 % IV BOLUS (SEPSIS)
1000.0000 mL | Freq: Once | INTRAVENOUS | Status: AC
  Administered 2016-12-16: 1000 mL via INTRAVENOUS

## 2016-12-16 NOTE — Discharge Instructions (Signed)
Take Cipro twice daily for 5 days. Take percocet as needed for severe pain. Continue anti-inflammatories and steroids as directed. Follow up with oncologist and PCP for further evaluation and medication refills and adjustments. Return to ED for severe abdominal pain, trouble breathing, trouble swallowing, blood in stool, lightheadedness or loss of consciousness.

## 2016-12-16 NOTE — ED Provider Notes (Signed)
[facility] DEPT Provider Note   CSN: [id number] Arrival date & time: 12/16/16  0631     History   Chief Complaint Chief Complaint  Patient presents with  . Abdominal Pain    HPI Eric Beck is a 66 y.o. male.  HPI Patient, with a past medical history of prostate cancer, presents To the ED for lower abdominal pain. He received his last radiation treatment on 12/07/16, and has been experiencing lower abdominal pain since then. He was told that the pain would "ease off". He states that the pain has gotten progressively worse, even since his previous ED visit on 12/11/16. He states that he has not been taking his Percocet prescribed to him and when he does he only takes about 1/4-1/2 of a tablet each time. He is afraid that he will "get hooked on it." He also reports pain with urination and bowel movements (rectal pain). Constipation has resolved and is now having diarrhea. Denies chest pain, trouble breathing, blood in stool, blood in urine.  Past Medical History:  Diagnosis Date  . Prostate CA ([place])     There are no active problems to display for this patient.   Past Surgical History:  Procedure Laterality Date  . TOTAL HIP ARTHROPLASTY         Home Medications    Prior to Admission medications   Medication Sig Start Date End Date Taking? Authorizing Provider  acetaminophen (TYLENOL) 500 MG tablet Take 1,000 mg by mouth every 6 (six) hours as needed.    [provider]  aspirin 325 MG tablet Take 325 mg by mouth every 6 (six) hours as needed for mild pain or headache.     [provider]  b complex vitamins tablet Take 1 tablet by mouth daily.    [provider]  cholecalciferol (VITAMIN D) 1000 units tablet Take 1,000 Units by mouth daily.    [provider]  ciprofloxacin (CIPRO) 500 MG tablet Take 1 tablet (500 mg total) by mouth every 12 (twelve) hours. 12/16/16 12/21/16  Denard Tuminello, PA-C  docusate sodium (COLACE) 100 MG capsule  Take 1 capsule (100 mg total) by mouth every 12 (twelve) hours. 12/11/16   Mesner, Corene Cornea, MD  ibuprofen (ADVIL,MOTRIN) 400 MG tablet Take 1 tablet (400 mg total) by mouth every 6 (six) hours as needed. 12/11/16   Mesner, Corene Cornea, MD  oxyCODONE-acetaminophen (PERCOCET/ROXICET) 5-325 MG tablet Take 1 tablet by mouth every 6 (six) hours as needed for severe pain. 12/16/16   Onyekachi Gathright, PA-C  polyethylene glycol (MIRALAX / GLYCOLAX) packet Take 17 g by mouth daily. 12/11/16   Mesner, Corene Cornea, MD  predniSONE (DELTASONE) 20 MG tablet 3 tabs po daily x 3 days, then 2 tabs x 3 days, then 1.5 tabs x 3 days, then 1 tab x 3 days, then 0.5 tabs x 3 days 12/11/16   Mesner, Corene Cornea, MD  sennosides-docusate sodium (SENOKOT-S) 8.6-50 MG tablet Take 1 tablet by mouth 2 (two) times daily.    [provider]  Tetrahydrozoline HCl (VISINE OP) Apply 2 drops to eye daily as needed (Eyes Burning).    [provider]  traMADol (ULTRAM) 50 MG tablet Take by mouth every 8 (eight) hours as needed.    [provider]    Family History History reviewed. No pertinent family history.  Social History Social History  Substance Use Topics  . Smoking status: Former Research scientist (life sciences)  . Smokeless tobacco: Never Used  . Alcohol use No     Allergies  Bee venom and Penicillins   Review of Systems Review of Systems  Constitutional: Negative for appetite change, chills and fever.  HENT: Negative for ear pain, rhinorrhea, sneezing and sore throat.   Eyes: Negative for photophobia and visual disturbance.  Respiratory: Negative for cough, chest tightness, shortness of breath and wheezing.   Cardiovascular: Negative for chest pain and palpitations.  Gastrointestinal: Positive for abdominal pain and diarrhea. Negative for blood in stool, constipation, nausea and vomiting.  Genitourinary: Positive for dysuria. Negative for hematuria and urgency.  Musculoskeletal: Negative for myalgias.  Skin: Negative for rash.    Neurological: Negative for dizziness, weakness and light-headedness.     Physical Exam Updated Vital Signs BP 137/71 (BP Location: Right Arm)   Pulse 64   Temp 97.6 F (36.4 C) (Oral)   Resp 18   Ht 5\' 7"  (1.702 m)   Wt 66.7 kg (147 lb)   SpO2 99%   BMI 23.02 kg/m   Physical Exam  Constitutional: He appears well-developed and well-nourished. No distress.  Appears uncomfortable on my initial evaluation.  HENT:  Head: Normocephalic and atraumatic.  Nose: Nose normal.  Eyes: Conjunctivae and EOM are normal. Right eye exhibits no discharge. Left eye exhibits no discharge. No scleral icterus.  Neck: Normal range of motion. Neck supple.  Cardiovascular: Normal rate, regular rhythm, normal heart sounds and intact distal pulses.  Exam reveals no gallop and no friction rub.   No murmur heard. Pulmonary/Chest: Effort normal and breath sounds normal. No respiratory distress.  Abdominal: Soft. Bowel sounds are normal. He exhibits no distension. There is tenderness. There is no guarding.    Musculoskeletal: Normal range of motion. He exhibits no edema.  Neurological: He is alert. He exhibits normal muscle tone. Coordination normal.  Skin: Skin is warm and dry. No rash noted.  Psychiatric: He has a normal mood and affect.  Nursing note and vitals reviewed.    ED Treatments / Results  Labs (all labs ordered are listed, but only abnormal results are displayed) Labs Reviewed  LIPASE, BLOOD - Abnormal; Notable for the following:       Result Value   Lipase 108 (*)    All other components within normal limits  COMPREHENSIVE METABOLIC PANEL - Abnormal; Notable for the following:    Glucose, Bld 106 (*)    BUN 23 (*)    All other components within normal limits  CBC - Abnormal; Notable for the following:    RBC 3.75 (*)    Hemoglobin 11.4 (*)    HCT 33.4 (*)    All other components within normal limits  URINALYSIS, ROUTINE W REFLEX MICROSCOPIC - Abnormal; Notable for the  following:    APPearance CLOUDY (*)    Hgb urine dipstick LARGE (*)    Protein, ur 100 (*)    Leukocytes, UA LARGE (*)    Bacteria, UA RARE (*)    Squamous Epithelial / LPF 0-5 (*)    All other components within normal limits    EKG  EKG Interpretation None       Radiology No results found.  Procedures Procedures (including critical care time)  Medications Ordered in ED Medications  HYDROmorphone (DILAUDID) injection 1 mg (1 mg Intravenous Given 12/16/16 0740)  sodium chloride 0.9 % bolus 1,000 mL (1,000 mLs Intravenous New Bag/Given 12/16/16 0739)  ketorolac (TORADOL) 15 MG/ML injection 15 mg (15 mg Intravenous Given 12/16/16 0739)     Initial Impression / Assessment and Plan / ED Course  I have  reviewed the triage vital signs and the nursing notes.  Pertinent labs & imaging results that were available during my care of the patient were reviewed by me and considered in my medical decision making (see chart for details).     Patient presents to ED for evaluation of abdominal pain that has worsened since radiation treatment for prostate cancer approximately 10 days ago. He was seen here in the emergency room about 5 days ago and was prescribed prednisone, ibuprofen, Percocet, Colace for symptoms of proctitis and constipation. CT scan done at that time showed inflammation around the bladder consistent with either cystitis or radiation therapy. He continues to have pain and his been taking his prednisone as prescribed. However when asked about his Percocet he states that he has been cutting the tablets into halves or quarters because he is "afraid that vomiting is gone it." Patient still has abdominal tenderness to palpation in the lower abdominal area bilaterally. Lab work today similar to lab work done at previous visit 5 days ago including CBC and CMP. Lipase mildly elevated but patient does have a history of pancreatitis. He has no epigastric tenderness to palpation or no recent  alcohol ingestion. Urinalysis shows evidence of UTI. I have low suspicion for acute intra-abdominal abnormality being the cause of his abdominal pain or need for repeat scanning at this time based on no worsening or new symptoms since previous visit 5 days ago, when he was diagnosed with proctitis. I suspect that this is due to not achieving maximum pain control at the therapeutic level. I educated patient that because he has a cause for his pain, he should take pain medications as needed at the full dose prescribed. I informed him that taking about 1 mg of oxycodone orally is not sufficient enough to reach therapeutic level. He states that he is taking his prednisone as prescribed which I told him would help with the inflammation around the area. We'll give a few more tablets of the Percocet and encourage patient to take these as needed for severe pain, 1 whole tablets as needed. His pain improved here in the ED with the fluids, Dilaudid and Toradol. He is afebrile with normal vital signs. We will discharge with Cipro for UTI encourage patient to follow up with PCP and oncology/urology for further evaluation. Patient appears stable for discharge at this time. Strict return precautions given.    Final Clinical Impressions(s) / ED Diagnoses   Final diagnoses:  Lower urinary tract infectious disease    New Prescriptions New Prescriptions   CIPROFLOXACIN (CIPRO) 500 MG TABLET    Take 1 tablet (500 mg total) by mouth every 12 (twelve) hours.   OXYCODONE-ACETAMINOPHEN (PERCOCET/ROXICET) 5-325 MG TABLET    Take 1 tablet by mouth every 6 (six) hours as needed for severe pain.     Delia Heady, PA-C 12/16/16 1047    Tegeler, Gwenyth Allegra, MD 12/17/16 1409

## 2016-12-16 NOTE — ED Triage Notes (Signed)
Pt presents by EMS for lower abd pain and has hx of prostate cancer and had radiation on Aug 24. Denies any nausea, vomiting, or diarrhea.

## 2016-12-17 ENCOUNTER — Emergency Department (HOSPITAL_COMMUNITY)
Admission: EM | Admit: 2016-12-17 | Discharge: 2016-12-17 | Disposition: A | Discharge status: Home / Self Care | Payer: Medicare Other | Attending: Emergency Medicine | Admitting: Emergency Medicine

## 2016-12-17 ENCOUNTER — Encounter (HOSPITAL_COMMUNITY): Payer: Self-pay | Admitting: Emergency Medicine

## 2016-12-17 DIAGNOSIS — Z87891 Personal history of nicotine dependence: Secondary | ICD-10-CM | POA: Diagnosis not present

## 2016-12-17 DIAGNOSIS — Z79899 Other long term (current) drug therapy: Secondary | ICD-10-CM | POA: Insufficient documentation

## 2016-12-17 DIAGNOSIS — Z8546 Personal history of malignant neoplasm of prostate: Secondary | ICD-10-CM | POA: Insufficient documentation

## 2016-12-17 DIAGNOSIS — R338 Other retention of urine: Secondary | ICD-10-CM

## 2016-12-17 DIAGNOSIS — R339 Retention of urine, unspecified: Secondary | ICD-10-CM | POA: Diagnosis not present

## 2016-12-17 LAB — URINALYSIS, ROUTINE W REFLEX MICROSCOPIC
Bilirubin Urine: NEGATIVE
Glucose, UA: NEGATIVE mg/dL
Ketones, ur: NEGATIVE mg/dL
Nitrite: NEGATIVE
Protein, ur: 100 mg/dL — AB
SPECIFIC GRAVITY, URINE: 1.019 (ref 1.005–1.030)
Squamous Epithelial / LPF: NONE SEEN
pH: 5 (ref 5.0–8.0)

## 2016-12-17 MED ORDER — OXYBUTYNIN CHLORIDE ER 5 MG PO TB24: 5.0000 mg | ORAL_TABLET | Freq: Every day | ORAL | 0 refills | Status: DC

## 2016-12-17 MED ORDER — OXYBUTYNIN CHLORIDE 5 MG PO TABS
5.0000 mg | ORAL_TABLET | Freq: Three times a day (TID) | ORAL | Status: DC
  Administered 2016-12-17: 5 mg via ORAL
  Filled 2016-12-17: qty 1

## 2016-12-17 MED ORDER — HYDROMORPHONE HCL 1 MG/ML IJ SOLN
1.0000 mg | Freq: Once | INTRAMUSCULAR | Status: AC
  Administered 2016-12-17: 1 mg via INTRAMUSCULAR
  Filled 2016-12-17: qty 1

## 2016-12-17 MED ORDER — POLYETHYLENE GLYCOL 3350 17 G PO PACK: 17.0000 g | PACK | Freq: Every day | ORAL | 0 refills | Status: DC

## 2016-12-17 MED ORDER — KETOROLAC TROMETHAMINE 60 MG/2ML IM SOLN
30.0000 mg | Freq: Once | INTRAMUSCULAR | Status: AC
  Administered 2016-12-17: 30 mg via INTRAMUSCULAR
  Filled 2016-12-17: qty 2

## 2016-12-17 MED ORDER — PHENAZOPYRIDINE HCL 200 MG PO TABS
200.0000 mg | ORAL_TABLET | Freq: Once | ORAL | Status: AC
  Administered 2016-12-17: 200 mg via ORAL
  Filled 2016-12-17: qty 1

## 2016-12-17 MED ORDER — OXYCODONE-ACETAMINOPHEN 5-325 MG PO TABS
1.0000 | ORAL_TABLET | Freq: Once | ORAL | Status: AC
  Administered 2016-12-17: 1 via ORAL
  Filled 2016-12-17: qty 1

## 2016-12-17 MED ORDER — OXYCODONE-ACETAMINOPHEN 5-325 MG PO TABS: 1.0000 | ORAL_TABLET | ORAL | 0 refills | Status: DC | PRN

## 2016-12-17 NOTE — ED Notes (Signed)
Called 3rd floor for a leg bag d/t none in our ED supply room.

## 2016-12-17 NOTE — ED Provider Notes (Signed)
[facility] DEPT Provider Note   CSN: [id number] Arrival date & time: 12/17/16  1415     History   Chief Complaint Chief Complaint  Patient presents with  . Urinary Retention  . Constipation    HPI Eric Beck is a 66 y.o. male.  HPI Patient is a 66 year old male with a history of prostate cancer currently receiving radiation therapy at [facility].  He presents emergency department inability to urinate since last night.  Foley catheter was placed on arrival to emergency department the patient has had 700 cc of urinary output are ready.  No preceding dysuria or urinary frequency.  No fevers or chills.  Denies abdominal pain or back pain.  Symptoms are moderate in severity prior to catheter placement.  He feels much better at this time.   Past Medical History:  Diagnosis Date  . Prostate CA ([place])     There are no active problems to display for this patient.   Past Surgical History:  Procedure Laterality Date  . TOTAL HIP ARTHROPLASTY         Home Medications    Prior to Admission medications   Medication Sig Start Date End Date Taking? Authorizing Provider  acetaminophen (TYLENOL) 500 MG tablet Take 1,000 mg by mouth every 6 (six) hours as needed for mild pain, moderate pain, fever or headache.    Yes [provider]  Ascorbic Acid (VITAMIN C PO) Take 1 tablet by mouth daily.   Yes [provider]  aspirin 325 MG tablet Take 325 mg by mouth every 6 (six) hours as needed for mild pain or headache.    Yes [provider]  b complex vitamins tablet Take 1 tablet by mouth daily.   Yes [provider]  cholecalciferol (VITAMIN D) 1000 units tablet Take 1,000 Units by mouth daily.   Yes [provider]  ciprofloxacin (CIPRO) 500 MG tablet Take 1 tablet (500 mg total) by mouth every 12 (twelve) hours. 12/16/16 12/21/16 Yes Khatri, Hina, PA-C  docusate sodium (COLACE) 100 MG capsule Take 1 capsule (100 mg total) by  mouth every 12 (twelve) hours. 12/11/16  Yes Mesner, Corene Cornea, MD  ibuprofen (ADVIL,MOTRIN) 400 MG tablet Take 1 tablet (400 mg total) by mouth every 6 (six) hours as needed. 12/11/16  Yes Mesner, Corene Cornea, MD  oxyCODONE-acetaminophen (PERCOCET/ROXICET) 5-325 MG tablet Take 1 tablet by mouth every 6 (six) hours as needed for severe pain. Patient taking differently: Take 1-2 tablets by mouth every 6 (six) hours as needed for severe pain.  12/16/16  Yes Khatri, Hina, PA-C  predniSONE (DELTASONE) 20 MG tablet 3 tabs po daily x 3 days, then 2 tabs x 3 days, then 1.5 tabs x 3 days, then 1 tab x 3 days, then 0.5 tabs x 3 days 12/11/16  Yes Mesner, Corene Cornea, MD  sennosides-docusate sodium (SENOKOT-S) 8.6-50 MG tablet Take 1 tablet by mouth 2 (two) times daily.   Yes [provider]  Tetrahydrozoline HCl (VISINE OP) Apply 2 drops to eye daily as needed (Eyes Burning).   Yes [provider]  traMADol (ULTRAM) 50 MG tablet Take 50 mg by mouth every 8 (eight) hours as needed for moderate pain.    Yes [provider]  polyethylene glycol (MIRALAX / GLYCOLAX) packet Take 17 g by mouth daily. 12/17/16   Eric Schmidt, MD    Family History History reviewed. No pertinent family history.  Social History Social History  Substance Use Topics  . Smoking status: Former  Smoker  . Smokeless tobacco: Never Used  . Alcohol use No     Allergies   Bee venom and Penicillins   Review of Systems Review of Systems  All other systems reviewed and are negative.    Physical Exam Updated Vital Signs BP (!) 151/88   Pulse 75   Temp 98.1 F (36.7 C) (Oral)   Resp 14   SpO2 100%   Physical Exam  Constitutional: He is oriented to person, place, and time. He appears well-developed and well-nourished.  HENT:  Head: Normocephalic and atraumatic.  Eyes: EOM are normal.  Neck: Normal range of motion.  Cardiovascular: Normal rate, regular rhythm and normal heart sounds.   Pulmonary/Chest: Effort  normal and breath sounds normal. No respiratory distress.  Abdominal: Soft. He exhibits no distension. There is no tenderness.  Musculoskeletal: Normal range of motion.  Neurological: He is alert and oriented to person, place, and time.  Skin: Skin is warm and dry.  Psychiatric: He has a normal mood and affect. Judgment normal.  Nursing note and vitals reviewed.    ED Treatments / Results  Labs (all labs ordered are listed, but only abnormal results are displayed) Labs Reviewed  URINALYSIS, ROUTINE W REFLEX MICROSCOPIC - Abnormal; Notable for the following:       Result Value   APPearance CLOUDY (*)    Hgb urine dipstick LARGE (*)    Protein, ur 100 (*)    Leukocytes, UA LARGE (*)    Bacteria, UA RARE (*)    All other components within normal limits  URINE CULTURE    EKG  EKG Interpretation None       Radiology No results found.  Procedures Procedures (including critical care time)  Medications Ordered in ED Medications - No data to display   Initial Impression / Assessment and Plan / ED Course  I have reviewed the triage vital signs and the nursing notes.  Pertinent labs & imaging results that were available during my care of the patient were reviewed by me and considered in my medical decision making (see chart for details).     Acute urinary retention.  Resolved after placement of Foley catheter.  Discharged home with leg bag to follow-up with his team at [facility] health.  He understands return to the ER for new or worsening symptoms  Final Clinical Impressions(s) / ED Diagnoses   Final diagnoses:  Acute urinary retention    New Prescriptions Current Discharge Medication List       Eric Schmidt, MD 12/17/16 1557

## 2016-12-17 NOTE — ED Notes (Signed)
Bed: QJ19 Expected date:  Expected time:  Means of arrival:  Comments: Hold triage

## 2016-12-17 NOTE — ED Triage Notes (Signed)
Pt c/o urinary retention, states he has not voided since yesterday at noon. Pt also c/o constipation. Pt reports that he has finished radiation for prostate cancer on 8/24 and had been receiving double dose of radiation for treatment.

## 2016-12-17 NOTE — ED Notes (Signed)
Pt called out for having severe pain in bladder area. EDP notified, medication ordered and given.

## 2016-12-18 LAB — URINE CULTURE: CULTURE: NO GROWTH

## 2019-09-19 ENCOUNTER — Emergency Department (HOSPITAL_COMMUNITY)
Admission: EM | Admit: 2019-09-19 | Discharge: 2019-09-19 | Disposition: A | Discharge status: Home / Self Care | Payer: No Typology Code available for payment source | Attending: Emergency Medicine | Admitting: Emergency Medicine

## 2019-09-19 ENCOUNTER — Emergency Department (HOSPITAL_COMMUNITY): Payer: No Typology Code available for payment source

## 2019-09-19 ENCOUNTER — Other Ambulatory Visit: Payer: Self-pay

## 2019-09-19 DIAGNOSIS — R229 Localized swelling, mass and lump, unspecified: Secondary | ICD-10-CM | POA: Diagnosis present

## 2019-09-19 DIAGNOSIS — L03213 Periorbital cellulitis: Secondary | ICD-10-CM | POA: Diagnosis not present

## 2019-09-19 DIAGNOSIS — Z79899 Other long term (current) drug therapy: Secondary | ICD-10-CM | POA: Insufficient documentation

## 2019-09-19 DIAGNOSIS — Z87891 Personal history of nicotine dependence: Secondary | ICD-10-CM | POA: Diagnosis not present

## 2019-09-19 DIAGNOSIS — Z79891 Long term (current) use of opiate analgesic: Secondary | ICD-10-CM | POA: Insufficient documentation

## 2019-09-19 DIAGNOSIS — H109 Unspecified conjunctivitis: Secondary | ICD-10-CM | POA: Diagnosis not present

## 2019-09-19 MED ORDER — POLYMYXIN B-TRIMETHOPRIM 10000-0.1 UNIT/ML-% OP SOLN: OPHTHALMIC | 0 refills | Status: DC

## 2019-09-19 MED ORDER — POLYMYXIN B-TRIMETHOPRIM 10000-0.1 UNIT/ML-% OP SOLN
1.0000 [drp] | OPHTHALMIC | Status: DC
  Administered 2019-09-19: 1 [drp] via OPHTHALMIC

## 2019-09-19 MED ORDER — FLUORESCEIN SODIUM 1 MG OP STRP
1.0000 | ORAL_STRIP | Freq: Once | OPHTHALMIC | Status: AC
  Administered 2019-09-19: 1 via OPHTHALMIC
  Filled 2019-09-19: qty 1

## 2019-09-19 MED ORDER — CLINDAMYCIN HCL 300 MG PO CAPS
300.0000 mg | ORAL_CAPSULE | Freq: Once | ORAL | Status: AC
  Administered 2019-09-19: 300 mg via ORAL
  Filled 2019-09-19: qty 1

## 2019-09-19 MED ORDER — CLINDAMYCIN HCL 300 MG PO CAPS: 300.0000 mg | ORAL_CAPSULE | Freq: Three times a day (TID) | ORAL | 0 refills | Status: DC

## 2019-09-19 MED ORDER — TETRACAINE HCL 0.5 % OP SOLN
2.0000 [drp] | Freq: Once | OPHTHALMIC | Status: AC
  Administered 2019-09-19: 2 [drp] via OPHTHALMIC
  Filled 2019-09-19: qty 4

## 2019-09-19 NOTE — ED Triage Notes (Signed)
Per patient his eye has been swelling since Thursday night/Friday morning. Patient tried using a warm compress. Patient visited np at [facility] and was instructed to come to ED. Patient states he was weed eating his yard Wednesday night.

## 2019-09-19 NOTE — Discharge Instructions (Signed)
Please take clindamycin 3 times daily for a week.  Use Polytrim every 2 hours during the day for 2 days then every 4 hours for 2 days then every 6 hours for 2 days  If your eye is still swollen in a week, you need to see eye doctor for follow-up.  Return to ER if you have worse eye swelling and pain, purulent discharge, fevers, blurry vision, double vision.

## 2019-09-19 NOTE — ED Provider Notes (Signed)
[facility] DEPT Provider Note   CSN: [id number] Arrival date & time: 09/19/19  1418     History Chief Complaint  Patient presents with  . Facial Swelling    Eric Beck is a 69 y.o. male hx of prostate cancer, here presenting with facial swelling.  Patient states that he was in the yard and helped his neighbor go down some weed 3 days ago. He states that 2 days ago, he noticed some swelling of the left eyelid.  He has been trying some warm compresses with minimal relief.  He states that he symptoms of hard time opening his eyes and there is some purulent discharge from the eye.  Patient denies any double vision or blurry vision.  Patient denies any fevers or chills.  Denies any sick contacts.  The history is provided by the patient.       Past Medical History:  Diagnosis Date  . Prostate CA ([place])     There are no problems to display for this patient.   Past Surgical History:  Procedure Laterality Date  . TOTAL HIP ARTHROPLASTY         No family history on file.  Social History   Tobacco Use  . Smoking status: Former Research scientist (life sciences)  . Smokeless tobacco: Never Used  Substance Use Topics  . Alcohol use: No  . Drug use: No    Home Medications Prior to Admission medications   Medication Sig Start Date End Date Taking? Authorizing Provider  acetaminophen (TYLENOL) 500 MG tablet Take 1,000 mg by mouth every 6 (six) hours as needed for mild pain, moderate pain, fever or headache.     [provider]  Ascorbic Acid (VITAMIN C PO) Take 1 tablet by mouth daily.    [provider]  aspirin 325 MG tablet Take 325 mg by mouth every 6 (six) hours as needed for mild pain or headache.     [provider]  b complex vitamins tablet Take 1 tablet by mouth daily.    [provider]  cholecalciferol (VITAMIN D) 1000 units tablet Take 1,000 Units by mouth daily.    [provider]  docusate sodium (COLACE) 100 MG  capsule Take 1 capsule (100 mg total) by mouth every 12 (twelve) hours. 12/11/16   Mesner, Corene Cornea, MD  ibuprofen (ADVIL,MOTRIN) 400 MG tablet Take 1 tablet (400 mg total) by mouth every 6 (six) hours as needed. 12/11/16   Mesner, Corene Cornea, MD  oxybutynin (DITROPAN XL) 5 MG 24 hr tablet Take 1 tablet (5 mg total) by mouth at bedtime. Only take as needed for bladder spasm 12/17/16   Blanchie Dessert, MD  oxyCODONE-acetaminophen (PERCOCET/ROXICET) 5-325 MG tablet Take 1 tablet by mouth every 6 (six) hours as needed for severe pain. Patient taking differently: Take 1-2 tablets by mouth every 6 (six) hours as needed for severe pain.  12/16/16   Khatri, Hina, PA-C  oxyCODONE-acetaminophen (PERCOCET/ROXICET) 5-325 MG tablet Take 1-2 tablets by mouth every 4 (four) hours as needed for severe pain. 12/17/16   Blanchie Dessert, MD  polyethylene glycol (MIRALAX / GLYCOLAX) packet Take 17 g by mouth daily. 12/17/16   Jola Schmidt, MD  predniSONE (DELTASONE) 20 MG tablet 3 tabs po daily x 3 days, then 2 tabs x 3 days, then 1.5 tabs x 3 days, then 1 tab x 3 days, then 0.5 tabs x 3 days 12/11/16   Mesner, Corene Cornea, MD  sennosides-docusate sodium (SENOKOT-S) 8.6-50 MG tablet Take 1 tablet by mouth  2 (two) times daily.    [provider]  Tetrahydrozoline HCl (VISINE OP) Apply 2 drops to eye daily as needed (Eyes Burning).    [provider]  traMADol (ULTRAM) 50 MG tablet Take 50 mg by mouth every 8 (eight) hours as needed for moderate pain.     [provider]    Allergies    Bee venom and Penicillins  Review of Systems   Review of Systems  HENT: Positive for ear discharge and ear pain.        L eye pain and swelling   All other systems reviewed and are negative.   Physical Exam Updated Vital Signs BP (!) 168/74 (BP Location: Right Arm)   Pulse 88   Temp 98.5 F (36.9 C) (Oral)   Resp 17   Ht 5\' 7"  (1.702 m)   Wt 77.1 kg   SpO2 100%   BMI 26.63 kg/m   Physical Exam Vitals and  nursing note reviewed.  HENT:     Head: Normocephalic.  Eyes:     Comments: Left eyelid swollen.  There is no foreign body under the lower eyelids.  Patient's conjunctiva is diffusely erythematous on the left side.  Fluorescein stain applied and there is corneal abrasion on the lateral aspect of the eye.  No obvious corneal ulcers.  His eye pressure is 20 on the left and 22 on the right.  Right eye conjunctiva is normal.  Extraocular movements intact bilaterally.   Cardiovascular:     Rate and Rhythm: Normal rate and regular rhythm.     Pulses: Normal pulses.     Heart sounds: Normal heart sounds.  Pulmonary:     Effort: Pulmonary effort is normal.     Breath sounds: Normal breath sounds.  Abdominal:     General: Abdomen is flat.     Palpations: Abdomen is soft.  Musculoskeletal:        General: Normal range of motion.     Cervical back: Normal range of motion.  Skin:    General: Skin is warm.     Capillary Refill: Capillary refill takes less than 2 seconds.  Neurological:     General: No focal deficit present.     Mental Status: He is alert.  Psychiatric:        Mood and Affect: Mood normal.        Behavior: Behavior normal.     ED Results / Procedures / Treatments   Labs (all labs ordered are listed, but only abnormal results are displayed) Labs Reviewed - No data to display  EKG None  Radiology No results found.  Procedures Procedures (including critical care time)  Medications Ordered in ED Medications  tetracaine (PONTOCAINE) 0.5 % ophthalmic solution 2 drop (2 drops Right Eye Given by Other 09/19/19 1629)  fluorescein ophthalmic strip 1 strip (1 strip Left Eye Given 09/19/19 1629)  clindamycin (CLEOCIN) capsule 300 mg (300 mg Oral Given 09/19/19 1627)    ED Course  I have reviewed the triage vital signs and the nursing notes.  Pertinent labs & imaging results that were available during my care of the patient were reviewed by me and considered in my medical  decision making (see chart for details).    MDM Rules/Calculators/A&P                      Eric Beck is a 69 y.o. male here presenting with left eye pain and swelling.  He does  have small corneal abrasion but there is no foreign body.  He does have impressive eyelid swelling and concern for either periorbital or orbital cellulitis.  He CT scan showed no abscess or orbital cellulitis.  Patient is not diabetic.  Patient does have penicillin allergy so will discharge home with clindamycin as well as Polytrim drops.  Will refer to ophthalmology outpatient as well.  Final Clinical Impression(s) / ED Diagnoses Final diagnoses:  None    Rx / DC Orders ED Discharge Orders    None       Drenda Freeze, MD 09/19/19 1701

## 2019-10-06 ENCOUNTER — Emergency Department (HOSPITAL_COMMUNITY)
Admission: EM | Admit: 2019-10-06 | Discharge: 2019-10-06 | Disposition: A | Discharge status: Home / Self Care | Payer: No Typology Code available for payment source | Attending: Emergency Medicine | Admitting: Emergency Medicine

## 2019-10-06 ENCOUNTER — Other Ambulatory Visit: Payer: Self-pay

## 2019-10-06 ENCOUNTER — Encounter (HOSPITAL_COMMUNITY): Payer: Self-pay | Admitting: Emergency Medicine

## 2019-10-06 DIAGNOSIS — R220 Localized swelling, mass and lump, head: Secondary | ICD-10-CM | POA: Diagnosis present

## 2019-10-06 DIAGNOSIS — Z87891 Personal history of nicotine dependence: Secondary | ICD-10-CM | POA: Insufficient documentation

## 2019-10-06 DIAGNOSIS — H1033 Unspecified acute conjunctivitis, bilateral: Secondary | ICD-10-CM | POA: Diagnosis not present

## 2019-10-06 MED ORDER — TETRACAINE HCL 0.5 % OP SOLN
2.0000 [drp] | Freq: Once | OPHTHALMIC | Status: AC
  Administered 2019-10-06: 2 [drp] via OPHTHALMIC
  Filled 2019-10-06: qty 4

## 2019-10-06 MED ORDER — FLUORESCEIN SODIUM 1 MG OP STRP
1.0000 | ORAL_STRIP | Freq: Once | OPHTHALMIC | Status: AC
  Administered 2019-10-06: 1 via OPHTHALMIC
  Filled 2019-10-06: qty 1

## 2019-10-06 MED ORDER — OFLOXACIN 0.3 % OP SOLN
1.0000 [drp] | Freq: Once | OPHTHALMIC | Status: AC
  Administered 2019-10-06: 1 [drp] via OPHTHALMIC
  Filled 2019-10-06: qty 5

## 2019-10-06 NOTE — ED Triage Notes (Signed)
Pt report his left eye was swelling back up and watery for the past couple days. Was seen here for it earlier this month and got better.

## 2019-10-06 NOTE — ED Provider Notes (Signed)
[facility] DEPT Provider Note   CSN: [id number] Arrival date & time: 10/06/19  1352     History Chief Complaint  Patient presents with  . Facial Swelling    Eric Beck is a 69 y.o. male.  Patient is a 69 year old male with history of prostate cancer presenting to the emergency department for eyelid swelling.  Patient reports that he was seen on the fifth of this month for similar.  At that time he was diagnosed with cellulitis of the eyelid as well as a corneal abrasion.  He had a CT scan showing no signs of orbital or preorbital cellulitis.  He was started on clindamycin and eyedrops and referred to ophthalmologist.  He reports that he did not follow-up with ophthalmologist because he did not have transportation.  He reports that the swelling did get better but over the last couple of days it seems to be returning.  Reports that it is not as bad as it was previously.  He reports that it feels like he has something stuck in the medial part of his left thigh.  Also reports that now his right eye is red with purulent drainage and is irritated.  He denies any injury, trauma        Past Medical History:  Diagnosis Date  . Prostate CA ([place])     There are no problems to display for this patient.   Past Surgical History:  Procedure Laterality Date  . TOTAL HIP ARTHROPLASTY         No family history on file.  Social History   Tobacco Use  . Smoking status: Former Research scientist (life sciences)  . Smokeless tobacco: Never Used  Substance Use Topics  . Alcohol use: No  . Drug use: No    Home Medications Prior to Admission medications   Medication Sig Start Date End Date Taking? Authorizing Provider  acetaminophen (TYLENOL) 500 MG tablet Take 1,000 mg by mouth every 6 (six) hours as needed for mild pain, moderate pain, fever or headache.     [provider]  Ascorbic Acid (VITAMIN C PO) Take 1 tablet by mouth daily.    [provider]  aspirin  325 MG tablet Take 325 mg by mouth every 6 (six) hours as needed for mild pain or headache.     [provider]  b complex vitamins tablet Take 1 tablet by mouth daily.    [provider]  cholecalciferol (VITAMIN D) 1000 units tablet Take 1,000 Units by mouth daily.    [provider]  clindamycin (CLEOCIN) 300 MG capsule Take 1 capsule (300 mg total) by mouth 3 (three) times daily. 09/19/19   Drenda Freeze, MD  docusate sodium (COLACE) 100 MG capsule Take 1 capsule (100 mg total) by mouth every 12 (twelve) hours. 12/11/16   Mesner, Corene Cornea, MD  ibuprofen (ADVIL,MOTRIN) 400 MG tablet Take 1 tablet (400 mg total) by mouth every 6 (six) hours as needed. 12/11/16   Mesner, Corene Cornea, MD  oxybutynin (DITROPAN XL) 5 MG 24 hr tablet Take 1 tablet (5 mg total) by mouth at bedtime. Only take as needed for bladder spasm 12/17/16   Blanchie Dessert, MD  oxyCODONE-acetaminophen (PERCOCET/ROXICET) 5-325 MG tablet Take 1 tablet by mouth every 6 (six) hours as needed for severe pain. Patient taking differently: Take 1-2 tablets by mouth every 6 (six) hours as needed for severe pain.  12/16/16   Khatri, Hina, PA-C  oxyCODONE-acetaminophen (PERCOCET/ROXICET) 5-325 MG tablet Take 1-2 tablets by  mouth every 4 (four) hours as needed for severe pain. 12/17/16   Blanchie Dessert, MD  polyethylene glycol (MIRALAX / GLYCOLAX) packet Take 17 g by mouth daily. 12/17/16   Jola Schmidt, MD  predniSONE (DELTASONE) 20 MG tablet 3 tabs po daily x 3 days, then 2 tabs x 3 days, then 1.5 tabs x 3 days, then 1 tab x 3 days, then 0.5 tabs x 3 days 12/11/16   Mesner, Corene Cornea, MD  sennosides-docusate sodium (SENOKOT-S) 8.6-50 MG tablet Take 1 tablet by mouth 2 (two) times daily.    [provider]  Tetrahydrozoline HCl (VISINE OP) Apply 2 drops to eye daily as needed (Eyes Burning).    [provider]  traMADol (ULTRAM) 50 MG tablet Take 50 mg by mouth every 8 (eight) hours as needed for moderate pain.      [provider]  trimethoprim-polymyxin b (POLYTRIM) ophthalmic solution Use 2 drops every 2 hrs while awake for 2 days then every 4 hrs for 2 days then every 6 hrs for 2 days 09/19/19   Drenda Freeze, MD    Allergies    Bee venom and Penicillins  Review of Systems   Review of Systems  Constitutional: Negative for appetite change, chills and fever.  Eyes: Positive for photophobia, pain, discharge, redness and itching. Negative for visual disturbance.  Respiratory: Negative for cough and shortness of breath.   Gastrointestinal: Negative for abdominal pain, nausea and vomiting.  Skin: Negative for rash.    Physical Exam Updated Vital Signs BP (!) 168/91 (BP Location: Left Arm)   Pulse 69   Temp 98.3 F (36.8 C) (Oral)   Resp 17   SpO2 100%   Physical Exam Vitals and nursing note reviewed.  Constitutional:      General: He is not in acute distress.    Appearance: Normal appearance. He is not ill-appearing, toxic-appearing or diaphoretic.  HENT:     Head: Normocephalic.     Mouth/Throat:     Mouth: Mucous membranes are moist.  Eyes:     General: Lids are everted, no foreign bodies appreciated.        Right eye: Discharge present.        Left eye: Discharge present.    Extraocular Movements:     Right eye: Normal extraocular motion.     Left eye: Normal extraocular motion.     Conjunctiva/sclera:     Right eye: Right conjunctiva is injected. No chemosis, exudate or hemorrhage.    Left eye: Left conjunctiva is injected. Chemosis and exudate present. No hemorrhage.    Pupils:     Left eye: Fluorescein uptake present.      Comments: There is bilateral conjunctiva injection with some chemosis in the left eye.  The left eye with mild edema in the upper and lower lids but no erythema.  Mild photophobia of the left eye.  There is punctate area of uptake in the left eye when using fluorescein eye stain as shown in the diagram.   Pulmonary:     Effort: Pulmonary effort  is normal.  Skin:    General: Skin is dry.  Neurological:     Mental Status: He is alert.  Psychiatric:        Mood and Affect: Mood normal.     ED Results / Procedures / Treatments   Labs (all labs ordered are listed, but only abnormal results are displayed) Labs Reviewed - No data to display  EKG None  Radiology No  results found.  Procedures Procedures (including critical care time)  Medications Ordered in ED Medications  ofloxacin (OCUFLOX) 0.3 % ophthalmic solution 1 drop (has no administration in time range)  tetracaine (PONTOCAINE) 0.5 % ophthalmic solution 2 drop (2 drops Left Eye Given 10/06/19 1557)  fluorescein ophthalmic strip 1 strip (1 strip Left Eye Given 10/06/19 1557)    ED Course  I have reviewed the triage vital signs and the nursing notes.  Pertinent labs & imaging results that were available during my care of the patient were reviewed by me and considered in my medical decision making (see chart for details).  Clinical Course as of Oct 05 1612  Tue Oct 06, 2019  1613 Patient presenting with recurrence of eye swelling.  It is now bilateral.  He has bilateral conjunctivitis with a punctate area of fluorescein uptake in the left eye.  He has mild swelling to the left upper and lower eyelids but no significant swelling or erythema.  He has no pain with eye movements or fever.  No concern for orbital cellulitis.  We will treat him for bilateral bacterial conjunctivitis.  We will start ofloxacin drops and it was reiterated to the patient that it is very important that you follow-up with ophthalmology in 2 to 3 days.  He was advised to return to the emergency department if he has any new or worsening symptoms or if he is unable to go to ophthalmology for any reason.  He does not have any vision changes today.  He does not wear contacts or glasses   [KM]    Clinical Course User Index [KM] Kristine Royal   MDM Rules/Calculators/A&P                           Based on review of vitals, medical screening exam, lab work and/or imaging, there does not appear to be an acute, emergent etiology for the patient's symptoms. Counseled pt on good return precautions and encouraged both PCP and ED follow-up as needed.  Prior to discharge, I also discussed incidental imaging findings with patient in detail and advised appropriate, recommended follow-up in detail.  Clinical Impression: 1. Acute bacterial conjunctivitis of both eyes     Disposition: Discharge  Prior to providing a prescription for a controlled substance, I independently reviewed the patient's recent prescription history on the [place]. The patient had no recent or regular prescriptions and was deemed appropriate for a brief, less than 3 day prescription of narcotic for acute analgesia.  This note was prepared with assistance of Systems analyst. Occasional wrong-word or sound-a-like substitutions may have occurred due to the inherent limitations of voice recognition software.  Final Clinical Impression(s) / ED Diagnoses Final diagnoses:  Acute bacterial conjunctivitis of both eyes    Rx / DC Orders ED Discharge Orders    None       Kristine Royal 10/06/19 1615    Fredia Sorrow, MD 10/06/19 1721

## 2019-10-06 NOTE — Discharge Instructions (Addendum)
I believe that you have an infection in both of your eyes.  It is very important that you do not touch or rub your eyes.  If you do then please wash your hands immediately as this can be contagious.  We are giving you some eyedrops and it is very important that you follow-up with the ophthalmologist.  If you are unable to make it to the ophthalmologist in a couple of days or you have any new or worsening symptoms and please return to the emergency department.

## 2022-02-17 DIAGNOSIS — M542 Cervicalgia: Secondary | ICD-10-CM | POA: Diagnosis not present

## 2022-02-17 DIAGNOSIS — M549 Dorsalgia, unspecified: Secondary | ICD-10-CM | POA: Diagnosis not present

## 2023-04-04 ENCOUNTER — Other Ambulatory Visit: Payer: Self-pay

## 2023-04-04 ENCOUNTER — Emergency Department (HOSPITAL_COMMUNITY)
Admission: EM | Admit: 2023-04-04 | Discharge: 2023-04-04 | Disposition: A | Discharge status: Home / Self Care | Payer: Non-veteran care | Attending: Emergency Medicine | Admitting: Emergency Medicine

## 2023-04-04 ENCOUNTER — Emergency Department (HOSPITAL_COMMUNITY): Payer: Non-veteran care

## 2023-04-04 DIAGNOSIS — M779 Enthesopathy, unspecified: Secondary | ICD-10-CM | POA: Diagnosis not present

## 2023-04-04 DIAGNOSIS — M25551 Pain in right hip: Secondary | ICD-10-CM | POA: Diagnosis not present

## 2023-04-04 DIAGNOSIS — M76891 Other specified enthesopathies of right lower limb, excluding foot: Secondary | ICD-10-CM | POA: Diagnosis not present

## 2023-04-04 DIAGNOSIS — I10 Essential (primary) hypertension: Secondary | ICD-10-CM | POA: Diagnosis not present

## 2023-04-04 DIAGNOSIS — Z96643 Presence of artificial hip joint, bilateral: Secondary | ICD-10-CM | POA: Diagnosis not present

## 2023-04-04 DIAGNOSIS — M25572 Pain in left ankle and joints of left foot: Secondary | ICD-10-CM | POA: Diagnosis not present

## 2023-04-04 DIAGNOSIS — M778 Other enthesopathies, not elsewhere classified: Secondary | ICD-10-CM | POA: Diagnosis not present

## 2023-04-04 LAB — BASIC METABOLIC PANEL
Anion gap: 14 (ref 5–15)
BUN: 12 mg/dL (ref 8–23)
CO2: 22 mmol/L (ref 22–32)
Calcium: 9.7 mg/dL (ref 8.9–10.3)
Chloride: 101 mmol/L (ref 98–111)
Creatinine, Ser: 0.8 mg/dL (ref 0.61–1.24)
GFR, Estimated: >60 mL/min (ref >60)
Glucose, Bld: 93 mg/dL (ref 70–99)
Potassium: 4.1 mmol/L (ref 3.5–5.1)
Sodium: 137 mmol/L (ref 135–145)

## 2023-04-04 LAB — CBC
HCT: 42.4 % (ref 39.0–52.0)
Hemoglobin: 14.5 g/dL (ref 13.0–17.0)
MCH: 32.8 pg (ref 26.0–34.0)
MCHC: 34.2 g/dL (ref 30.0–36.0)
MCV: 95.9 fL (ref 80.0–100.0)
Platelets: 156 10*3/uL (ref 150–400)
RBC: 4.42 MIL/uL (ref 4.22–5.81)
RDW: 13.6 % (ref 11.5–15.5)
WBC: 7.4 10*3/uL (ref 4.0–10.5)
nRBC: 0 % (ref 0.0–0.2)

## 2023-04-04 MED ORDER — DEXAMETHASONE SODIUM PHOSPHATE 10 MG/ML IJ SOLN
10.0000 mg | Freq: Once | INTRAMUSCULAR | Status: AC
  Administered 2023-04-04: 10 mg via INTRAVENOUS
  Filled 2023-04-04: qty 1

## 2023-04-04 MED ORDER — OXYCODONE-ACETAMINOPHEN 5-325 MG PO TABS: 1.0000 | ORAL_TABLET | ORAL | 0 refills | Status: AC | PRN

## 2023-04-04 MED ORDER — KETOROLAC TROMETHAMINE 30 MG/ML IJ SOLN
15.0000 mg | Freq: Once | INTRAMUSCULAR | Status: AC
  Administered 2023-04-04: 15 mg via INTRAVENOUS
  Filled 2023-04-04: qty 1

## 2023-04-04 MED ORDER — HYDROMORPHONE HCL 1 MG/ML IJ SOLN
1.0000 mg | Freq: Once | INTRAMUSCULAR | Status: AC
  Administered 2023-04-04: 1 mg via INTRAVENOUS
  Filled 2023-04-04: qty 1

## 2023-04-04 MED ORDER — ONDANSETRON HCL 4 MG/2ML IJ SOLN
4.0000 mg | Freq: Once | INTRAMUSCULAR | Status: AC
  Administered 2023-04-04: 4 mg via INTRAVENOUS
  Filled 2023-04-04: qty 2

## 2023-04-04 NOTE — ED Triage Notes (Signed)
Pt reports right sided hip pain and lower right back pain. Pt reports he was lifting heavy suitcases today and then a few hours later he began having he pain. Pt is able to take a couple steps but reports intense pain. Pt denies falls or other injuries. Hx of hip replacement.

## 2023-04-04 NOTE — ED Provider Notes (Signed)
[place] EMERGENCY DEPARTMENT AT [facility] Provider Note   CSN: [id number] Arrival date & time: 04/04/23  0159     History  Chief Complaint  Patient presents with   Hip Pain    Eric Beck is a 71 y.o. male.  Patient presents to the emergency department for evaluation of right hip and groin area pain.  Patient reports that he lifted some heavy objects earlier today but no direct injury noted.  Patient reports pain with movement at the hip.  If he is lying down or sitting down that he has difficulty getting up because of increased pain.       Home Medications Prior to Admission medications   Medication Sig Start Date End Date Taking? Authorizing Provider  oxyCODONE-acetaminophen (PERCOCET) 5-325 MG tablet Take 1-2 tablets by mouth every 4 (four) hours as needed. 04/04/23  Yes Anaelle Dunton, Canary Brim, MD  acetaminophen (TYLENOL) 500 MG tablet Take 1,000 mg by mouth every 6 (six) hours as needed for mild pain, moderate pain, fever or headache.     [provider]  Ascorbic Acid (VITAMIN C PO) Take 1 tablet by mouth daily.    [provider]  aspirin 325 MG tablet Take 325 mg by mouth every 6 (six) hours as needed for mild pain or headache.     [provider]  b complex vitamins tablet Take 1 tablet by mouth daily.    [provider]  cholecalciferol (VITAMIN D) 1000 units tablet Take 1,000 Units by mouth daily.    [provider]  sennosides-docusate sodium (SENOKOT-S) 8.6-50 MG tablet Take 1 tablet by mouth 2 (two) times daily.    [provider]  Tetrahydrozoline HCl (VISINE OP) Apply 2 drops to eye daily as needed (Eyes Burning).    [provider]  traMADol (ULTRAM) 50 MG tablet Take 50 mg by mouth every 8 (eight) hours as needed for moderate pain.     [provider]      Allergies    Bee venom and Penicillins    Review of Systems   Review of Systems  Physical Exam Updated Vital  Signs BP (!) 166/104 (BP Location: Left Arm)   Pulse 75   Temp 97.7 F (36.5 C) (Oral)   Resp 20   SpO2 99%  Physical Exam Vitals and nursing note reviewed.  Constitutional:      Appearance: Normal appearance.  HENT:     Head: Normocephalic and atraumatic.  Cardiovascular:     Rate and Rhythm: Normal rate.  Pulmonary:     Effort: Pulmonary effort is normal.  Abdominal:     Palpations: Abdomen is soft.     Tenderness: There is no abdominal tenderness.     Hernia: No hernia is present.  Musculoskeletal:       Legs:  Neurological:     Mental Status: He is alert.     ED Results / Procedures / Treatments   Labs (all labs ordered are listed, but only abnormal results are displayed) Labs Reviewed  CBC  BASIC METABOLIC PANEL    EKG None  Radiology DG Hip Unilat W or Wo Pelvis 2-3 Views Right Result Date: 04/04/2023 CLINICAL DATA:  Hip pain EXAM: DG HIP (WITH OR WITHOUT PELVIS) 2-3V RIGHT COMPARISON:  Pelvis x-ray 09/21/2015 FINDINGS: Bilateral hip arthroplasties are present in anatomic alignment. No fracture or hardware loosening. Soft tissues are within normal limits. IMPRESSION: Bilateral hip arthroplasties in anatomic alignment. Electronically Signed   By: Linton Rump  Malena Edman M.D.   On: 04/04/2023 03:04    Procedures Procedures    Medications Ordered in ED Medications  ketorolac (TORADOL) 30 MG/ML injection 15 mg (has no administration in time range)  dexamethasone (DECADRON) injection 10 mg (has no administration in time range)  HYDROmorphone (DILAUDID) injection 1 mg (1 mg Intravenous Given 04/04/23 0540)  ondansetron (ZOFRAN) injection 4 mg (4 mg Intravenous Given 04/04/23 2536)    ED Course/ Medical Decision Making/ A&P                                 Medical Decision Making Amount and/or Complexity of Data Reviewed Labs: ordered. Radiology: ordered.  Risk Prescription drug management.   Presents with right hip pain after lifting heavy objects.  Pain  does not seem to be radicular.  Patient is having pain and has some point tenderness at the anterior superior iliac crest area, especially with flexion at the hip.  Patient has had previous bilateral hip replacements, x-rays without acute problems.  Patient has normal strength, sensation, reflexes.  No sign of radicular pain, no red flags.  No signs of overlying infection.  Distal sensation, pulses intact.  Patient will be appropriate for treatment with rest and analgesia.        Final Clinical Impression(s) / ED Diagnoses Final diagnoses:  Hip flexor tendinitis, right    Rx / DC Orders ED Discharge Orders          Ordered    oxyCODONE-acetaminophen (PERCOCET) 5-325 MG tablet  Every 4 hours PRN        04/04/23 0631              Gilda Crease, MD 04/04/23 [phone]

## 2023-04-16 ENCOUNTER — Telehealth: Payer: Self-pay

## 2023-04-16 NOTE — Progress Notes (Signed)
 Transition Care Management Unsuccessful Follow-up Telephone Call  Date of discharge and from where:  04/04/2023 [facility]  Attempts:  1st Attempt  Reason for unsuccessful TCM follow-up call:  Left voice message  Pinkney Venard [facility]  [facility], [facility] Resource Care Guide Direct Dial: [phone]  Website: [email or web address]

## 2023-04-19 ENCOUNTER — Telehealth: Payer: Self-pay

## 2023-04-19 NOTE — Progress Notes (Signed)
 Transition Care Management Unsuccessful Follow-up Telephone Call  Date of discharge and from where:  04/04/2023 [facility]  Attempts:  2nd Attempt  Reason for unsuccessful TCM follow-up call:  Left voice message  Istvan Behar [facility]  [facility], [facility] Resource Care Guide Direct Dial: [phone]  Website: [email or web address]

## 2023-10-29 DIAGNOSIS — H02432 Paralytic ptosis of left eyelid: Secondary | ICD-10-CM | POA: Diagnosis not present

## 2023-10-29 DIAGNOSIS — F1090 Alcohol use, unspecified, uncomplicated: Secondary | ICD-10-CM | POA: Diagnosis not present

## 2023-10-29 DIAGNOSIS — I10 Essential (primary) hypertension: Secondary | ICD-10-CM | POA: Diagnosis not present

## 2023-10-29 DIAGNOSIS — Z91148 Patient's other noncompliance with medication regimen for other reason: Secondary | ICD-10-CM | POA: Diagnosis not present

## 2023-10-29 DIAGNOSIS — S0990XA Unspecified injury of head, initial encounter: Secondary | ICD-10-CM | POA: Diagnosis not present

## 2023-10-29 DIAGNOSIS — I69398 Other sequelae of cerebral infarction: Secondary | ICD-10-CM | POA: Diagnosis not present

## 2023-10-29 DIAGNOSIS — R55 Syncope and collapse: Secondary | ICD-10-CM | POA: Diagnosis not present

## 2023-10-29 DIAGNOSIS — E785 Hyperlipidemia, unspecified: Secondary | ICD-10-CM | POA: Diagnosis not present

## 2023-10-29 DIAGNOSIS — Z9181 History of falling: Secondary | ICD-10-CM | POA: Diagnosis not present

## 2023-10-30 DIAGNOSIS — I3489 Other nonrheumatic mitral valve disorders: Secondary | ICD-10-CM | POA: Diagnosis not present

## 2023-10-30 DIAGNOSIS — R55 Syncope and collapse: Secondary | ICD-10-CM | POA: Diagnosis not present
